# Patient Record
Sex: Female | Born: 1935
Health system: Southern US, Community
[De-identification: ages and names within clinical notes are randomized; demographics above are authoritative.]

## PROBLEM LIST (undated history)

## (undated) DIAGNOSIS — G629 Polyneuropathy, unspecified: Secondary | ICD-10-CM

## (undated) DIAGNOSIS — K219 Gastro-esophageal reflux disease without esophagitis: Secondary | ICD-10-CM

## (undated) DIAGNOSIS — I10 Essential (primary) hypertension: Secondary | ICD-10-CM

## (undated) DIAGNOSIS — Z9889 Other specified postprocedural states: Secondary | ICD-10-CM

## (undated) DIAGNOSIS — F419 Anxiety disorder, unspecified: Secondary | ICD-10-CM

## (undated) DIAGNOSIS — I739 Peripheral vascular disease, unspecified: Secondary | ICD-10-CM

## (undated) DIAGNOSIS — E119 Type 2 diabetes mellitus without complications: Secondary | ICD-10-CM

## (undated) DIAGNOSIS — I779 Disorder of arteries and arterioles, unspecified: Secondary | ICD-10-CM

## (undated) DIAGNOSIS — M1990 Unspecified osteoarthritis, unspecified site: Secondary | ICD-10-CM

## (undated) DIAGNOSIS — I480 Paroxysmal atrial fibrillation: Secondary | ICD-10-CM

## (undated) DIAGNOSIS — D649 Anemia, unspecified: Secondary | ICD-10-CM

## (undated) DIAGNOSIS — N183 Chronic kidney disease, stage 3 unspecified: Secondary | ICD-10-CM

## (undated) HISTORY — DX: Polyneuropathy, unspecified: G62.9

## (undated) HISTORY — DX: Anemia, unspecified: D64.9

## (undated) HISTORY — DX: Chronic kidney disease, stage 3 (moderate): N18.3

## (undated) HISTORY — DX: Anxiety disorder, unspecified: F41.9

## (undated) HISTORY — PX: SHOULDER ARTHROSCOPY W/ ROTATOR CUFF REPAIR: SHX2400

## (undated) HISTORY — DX: Other specified postprocedural states: Z98.890

## (undated) HISTORY — DX: Gastro-esophageal reflux disease without esophagitis: K21.9

## (undated) HISTORY — DX: Disorder of arteries and arterioles, unspecified: I77.9

## (undated) HISTORY — PX: ABDOMINAL HYSTERECTOMY: SHX81

## (undated) HISTORY — PX: CARPAL TUNNEL RELEASE: SHX101

## (undated) HISTORY — DX: Peripheral vascular disease, unspecified: I73.9

## (undated) HISTORY — DX: Chronic kidney disease, stage 3 unspecified: N18.30

## (undated) HISTORY — DX: Essential (primary) hypertension: I10

## (undated) HISTORY — PX: LUMBAR LAMINECTOMY: SHX95

## (undated) HISTORY — DX: Paroxysmal atrial fibrillation: I48.0

## (undated) HISTORY — PX: APPENDECTOMY: SHX54

---

## 2000-05-09 ENCOUNTER — Ambulatory Visit (HOSPITAL_COMMUNITY): Admission: RE | Admit: 2000-05-09 | Discharge: 2000-05-10 | Payer: Self-pay | Admitting: Ophthalmology

## 2000-05-09 ENCOUNTER — Encounter: Payer: Self-pay | Admitting: Ophthalmology

## 2001-03-18 ENCOUNTER — Encounter (INDEPENDENT_AMBULATORY_CARE_PROVIDER_SITE_OTHER): Payer: Self-pay | Admitting: Specialist

## 2001-03-18 ENCOUNTER — Ambulatory Visit (HOSPITAL_BASED_OUTPATIENT_CLINIC_OR_DEPARTMENT_OTHER): Admission: RE | Admit: 2001-03-18 | Discharge: 2001-03-19 | Payer: Self-pay | Admitting: Orthopedic Surgery

## 2003-06-16 ENCOUNTER — Inpatient Hospital Stay (HOSPITAL_COMMUNITY): Admission: EM | Admit: 2003-06-16 | Discharge: 2003-06-18 | Payer: Self-pay | Admitting: Cardiology

## 2003-12-17 ENCOUNTER — Encounter: Admission: RE | Admit: 2003-12-17 | Discharge: 2004-01-18 | Payer: Self-pay | Admitting: Family Medicine

## 2005-03-29 ENCOUNTER — Ambulatory Visit: Payer: Self-pay | Admitting: Cardiology

## 2005-05-02 ENCOUNTER — Ambulatory Visit: Payer: Self-pay | Admitting: Cardiology

## 2005-05-15 ENCOUNTER — Ambulatory Visit: Payer: Self-pay | Admitting: Cardiology

## 2005-06-18 ENCOUNTER — Ambulatory Visit: Payer: Self-pay | Admitting: Cardiology

## 2007-04-14 ENCOUNTER — Ambulatory Visit: Payer: Self-pay | Admitting: Cardiology

## 2007-05-04 ENCOUNTER — Encounter: Payer: Self-pay | Admitting: Cardiology

## 2008-10-20 ENCOUNTER — Encounter: Payer: Self-pay | Admitting: Cardiology

## 2009-02-15 ENCOUNTER — Encounter: Payer: Self-pay | Admitting: Cardiology

## 2009-03-28 ENCOUNTER — Encounter: Payer: Self-pay | Admitting: Cardiology

## 2009-04-21 ENCOUNTER — Encounter: Payer: Self-pay | Admitting: Cardiology

## 2009-04-21 ENCOUNTER — Ambulatory Visit: Payer: Self-pay | Admitting: Cardiology

## 2009-05-24 ENCOUNTER — Ambulatory Visit: Payer: Self-pay | Admitting: Cardiology

## 2009-06-06 ENCOUNTER — Ambulatory Visit: Payer: Self-pay | Admitting: Cardiology

## 2009-06-06 ENCOUNTER — Encounter: Payer: Self-pay | Admitting: Cardiology

## 2009-06-21 ENCOUNTER — Encounter: Payer: Self-pay | Admitting: Cardiology

## 2009-07-01 ENCOUNTER — Ambulatory Visit: Payer: Self-pay | Admitting: Cardiology

## 2009-07-19 ENCOUNTER — Encounter: Payer: Self-pay | Admitting: Cardiology

## 2009-07-19 ENCOUNTER — Ambulatory Visit: Payer: Self-pay | Admitting: Vascular Surgery

## 2009-10-17 ENCOUNTER — Ambulatory Visit: Payer: Self-pay | Admitting: Cardiology

## 2010-01-05 ENCOUNTER — Encounter: Admission: RE | Admit: 2010-01-05 | Discharge: 2010-01-05 | Payer: Self-pay | Admitting: Orthopedic Surgery

## 2010-01-19 ENCOUNTER — Inpatient Hospital Stay (HOSPITAL_COMMUNITY): Admission: RE | Admit: 2010-01-19 | Discharge: 2010-01-22 | Payer: Self-pay | Admitting: Orthopedic Surgery

## 2010-08-22 ENCOUNTER — Ambulatory Visit: Payer: Self-pay | Admitting: Vascular Surgery

## 2010-09-20 ENCOUNTER — Encounter: Payer: Self-pay | Admitting: Cardiology

## 2010-10-05 ENCOUNTER — Encounter: Payer: Self-pay | Admitting: Cardiology

## 2010-10-18 ENCOUNTER — Ambulatory Visit: Payer: Self-pay | Admitting: Cardiology

## 2010-12-10 HISTORY — PX: TONSILLECTOMY: SUR1361

## 2011-01-09 NOTE — Assessment & Plan Note (Signed)
Summary: 1 YR FUL   Visit Type:  Follow-up Primary Provider:  Donzetta Sprung, MD  CC:  CAD.  History of Present Illness: Patient is seen for followup of her cardiac status.  I saw her last in November, 2010.  At that time her carotid artery disease is being followed by the vascular surgeons.  She has mild mitral regurgitation.  She watches her blood pressure and it is normal at home.  We felt that her chest pain workup had shown no major abnormalities.  She's not had any pain returned since then.  Preventive Screening-Counseling & Management  Alcohol-Tobacco     Smoking Status: never  Current Medications (verified): 1)  Hydrochlorothiazide 25 Mg Tabs (Hydrochlorothiazide) .... Take 1/2  Tablet By Mouth Daily. 2)  Furosemide 40 Mg Tabs (Furosemide) .... Take 1/2-1 Tablet By Mouth Daily As Needed 3)  Diltiazem Hcl Er Beads 360 Mg Xr24h-Cap (Diltiazem Hcl Er Beads) .... Take One Capsule By Mouth Daily 4)  Omeprazole 20 Mg Cpdr (Omeprazole) .... Take 2 Tablet By Mouth Two Times A Day 5)  Benazepril Hcl 40 Mg Tabs (Benazepril Hcl) .... Take 1 Tablet By Mouth Once A Day 6)  Aspirin 81 Mg Tbec (Aspirin) .... Take One Tablet By Mouth Daily 7)  Alendronate Sodium 70 Mg Tabs (Alendronate Sodium) .... Take 1 Tablet By Mouth Once Per Week. 8)  Simvastatin 20 Mg Tabs (Simvastatin) .... Take One Tablet By Mouth Daily At Bedtime 9)  Lorazepam 0.5 Mg Tabs (Lorazepam) .... Take 1 Tablet By Mouth Three Times A Day As Needed 10)  Rena-Vite  Tabs (B Complex-C-Folic Acid) .... Take 1 Tablet By Mouth Once A Day 11)  Azelastine Hcl 0.05 % Soln (Azelastine Hcl) .... Use 1 Drop in Each Eye Twice Daily. 12)  Fish Oil Double Strength 1200 Mg Caps (Omega-3 Fatty Acids) .... Take 1 Capsule By Mouth Once A Day 13)  Multivitamins  Tabs (Multiple Vitamin) .... Take 1 Tablet By Mouth Once A Day 14)  Lantus Solostar 100 Unit/ml Soln (Insulin Glargine) .... As Needed 15)  Ferrous Sulfate 325 (65 Fe) Mg Tabs (Ferrous  Sulfate) .... Take 1 Tablet By Mouth Once A Day 16)  Caltrate 600+d Plus 600-400 Mg-Unit Tabs (Calcium Carbonate-Vit D-Min) .... Take 1 Tablet By Mouth Once A Day  Allergies (verified): 1)  ! Iodine 2)  ! * Latex 3)  ! * Shellfish 4)  * Ivp Dye 5)  Pcn 6)  Erythromycin  Comments:  Nurse/Medical Assistant: The patient's medication bottles and allergies were reviewed with the patient and were updated in the Medication and Allergy Lists.  Past History:  Past Medical History: HYPERTENSION, UNSPECIFIED (ICD-401.9) HYPERLIPIDEMIA-MIXED (ICD-272.4) CHEST PAIN-UNSPECIFIED (ICD-786.50)...catheterization... 2004... normal coronaries  /   thallium scan.. May, 2010.. no ischemia Mild chronic renal insufficiency diabetes... type II Peripheral neuropathy Atrial fibrillation  ???  question in the past.. not a recurring problem carotid artery disease  50-70% LICA.Marland Kitchen less than 50% RICA.Marland Kitchen Doppler.. June, 2010  /  Dr. Hart Rochester consultation.. August, 2010.. his protocol to follow patient LVH mild.... echo.  June, 2010 EF 60-65%... echo... June, 2010 Mitral regurgitation... mild... echo, 2010 Atrial septal aneurysm... echo... 2010.Marland Kitchen GERD Renal insufficiency.... mild Edema... mild... chronic Anxiety disorder  Review of Systems       Patient denies fever, chills, headache, sweats, rash, change in vision, change in hearing, chest pain, nausea vomiting, urinary symptoms.  All other systems are reviewed and are negative.  Vital Signs:  Patient profile:   77 year  old female Height:      70 inches Weight:      166 pounds BMI:     23.90 Pulse rate:   81 / minute BP sitting:   163 / 95  (left arm) Cuff size:   regular  Vitals Entered By: Carlye Grippe (October 18, 2010 1:11 PM)  Physical Exam  General:  patient looks good. Eyes:  no xanthelasma. Neck:  no jugular venous distention. Lungs:  lungs are clear.  Respiratory effort is nonlabored. Heart:  cardiac exam reveals S1-S2.  There is a  soft systolic murmur. Abdomen:  abdomen is soft. Extremities:  no peripheral edema. Psych:  patient is oriented to person time and place.  Affect is normal.   Impression & Recommendations:  Problem # 1:  CAROTID ARTERY DISEASE (ICD-433.10) Carotid disease is followed by Dr. Hart Rochester.  She is doing well.  Problem # 2:  MITRAL REGURGITATION (ICD-396.3) Mitral regurgitation is mild.  She does not need an echo.  Problem # 3:  EDEMA (ICD-782.3) She has trace edema in her left lower extremity.  This is not a significant issue.  I suspect it is mild venous insufficiency  Problem # 4:  HYPERTENSION, UNSPECIFIED (ICD-401.9)  Her updated medication list for this problem includes:    Hydrochlorothiazide 25 Mg Tabs (Hydrochlorothiazide) .Marland Kitchen... Take 1/2  tablet by mouth daily.    Furosemide 40 Mg Tabs (Furosemide) .Marland Kitchen... Take 1/2-1 tablet by mouth daily as needed    Diltiazem Hcl Er Beads 360 Mg Xr24h-cap (Diltiazem hcl er beads) .Marland Kitchen... Take one capsule by mouth daily    Benazepril Hcl 40 Mg Tabs (Benazepril hcl) .Marland Kitchen... Take 1 tablet by mouth once a day    Aspirin 81 Mg Tbec (Aspirin) .Marland Kitchen... Take one tablet by mouth daily Blood pressure is elevated today in the office.  She insists that remains normal at home and it is normal at Dr. Rosann Auerbach office.  I will not change her medicines today.  Problem # 5:  CHEST PAIN-UNSPECIFIED (ICD-786.50)  Her updated medication list for this problem includes:    Diltiazem Hcl Er Beads 360 Mg Xr24h-cap (Diltiazem hcl er beads) .Marland Kitchen... Take one capsule by mouth daily    Benazepril Hcl 40 Mg Tabs (Benazepril hcl) .Marland Kitchen... Take 1 tablet by mouth once a day    Aspirin 81 Mg Tbec (Aspirin) .Marland Kitchen... Take one tablet by mouth daily She is not having any recurrent chest pain.  No further workup is needed.  We'll see her back in one year.  Other Orders: EKG w/ Interpretation (93000)  Patient Instructions: 1)  Your physician wants you to follow-up in: 1 year. You will receive a  reminder letter in the mail one-two months in advance. If you don't receive a letter, please call our office to schedule the follow-up appointment. 2)  Your physician recommends that you continue on your current medications as directed. Please refer to the Current Medication list given to you today.

## 2011-02-14 ENCOUNTER — Ambulatory Visit: Payer: Medicare Other | Attending: Orthopedic Surgery | Admitting: Physical Therapy

## 2011-02-14 DIAGNOSIS — M542 Cervicalgia: Secondary | ICD-10-CM | POA: Insufficient documentation

## 2011-02-14 DIAGNOSIS — IMO0001 Reserved for inherently not codable concepts without codable children: Secondary | ICD-10-CM | POA: Insufficient documentation

## 2011-02-14 DIAGNOSIS — M25519 Pain in unspecified shoulder: Secondary | ICD-10-CM | POA: Insufficient documentation

## 2011-02-21 ENCOUNTER — Ambulatory Visit: Payer: Medicare Other | Admitting: Physical Therapy

## 2011-02-23 ENCOUNTER — Ambulatory Visit: Payer: Medicare Other | Admitting: Physical Therapy

## 2011-02-28 ENCOUNTER — Ambulatory Visit: Payer: Medicare Other | Admitting: Physical Therapy

## 2011-03-01 LAB — CBC
HCT: 32.5 % — ABNORMAL LOW (ref 36.0–46.0)
Hemoglobin: 11.6 g/dL — ABNORMAL LOW (ref 12.0–15.0)
Hemoglobin: 7.8 g/dL — ABNORMAL LOW (ref 12.0–15.0)
MCHC: 35.1 g/dL (ref 30.0–36.0)
MCHC: 35.5 g/dL (ref 30.0–36.0)
MCV: 94.2 fL (ref 78.0–100.0)
Platelets: 164 10*3/uL (ref 150–400)
Platelets: 169 10*3/uL (ref 150–400)
Platelets: 218 10*3/uL (ref 150–400)
RBC: 2.28 MIL/uL — ABNORMAL LOW (ref 3.87–5.11)
RDW: 13.1 % (ref 11.5–15.5)
RDW: 13.5 % (ref 11.5–15.5)
WBC: 6.6 10*3/uL (ref 4.0–10.5)
WBC: 7.5 10*3/uL (ref 4.0–10.5)

## 2011-03-01 LAB — COMPREHENSIVE METABOLIC PANEL
CO2: 28 mEq/L (ref 19–32)
Creatinine, Ser: 1.51 mg/dL — ABNORMAL HIGH (ref 0.4–1.2)
GFR calc Af Amer: 40 mL/min — ABNORMAL LOW (ref >60)
Glucose, Bld: 161 mg/dL — ABNORMAL HIGH (ref 70–99)
Total Protein: 7.5 g/dL (ref 6.0–8.3)

## 2011-03-01 LAB — PROTIME-INR: Prothrombin Time: 12.8 seconds (ref 11.6–15.2)

## 2011-03-01 LAB — GLUCOSE, CAPILLARY
Glucose-Capillary: 132 mg/dL — ABNORMAL HIGH (ref 70–99)
Glucose-Capillary: 137 mg/dL — ABNORMAL HIGH (ref 70–99)
Glucose-Capillary: 139 mg/dL — ABNORMAL HIGH (ref 70–99)
Glucose-Capillary: 145 mg/dL — ABNORMAL HIGH (ref 70–99)
Glucose-Capillary: 151 mg/dL — ABNORMAL HIGH (ref 70–99)
Glucose-Capillary: 152 mg/dL — ABNORMAL HIGH (ref 70–99)
Glucose-Capillary: 167 mg/dL — ABNORMAL HIGH (ref 70–99)
Glucose-Capillary: 81 mg/dL (ref 70–99)

## 2011-03-01 LAB — HEMOGLOBIN AND HEMATOCRIT, BLOOD: HCT: 21.4 % — ABNORMAL LOW (ref 36.0–46.0)

## 2011-03-01 LAB — BASIC METABOLIC PANEL
BUN: 18 mg/dL (ref 6–23)
CO2: 27 mEq/L (ref 19–32)
Calcium: 8.2 mg/dL — ABNORMAL LOW (ref 8.4–10.5)
Creatinine, Ser: 1.23 mg/dL — ABNORMAL HIGH (ref 0.4–1.2)
Glucose, Bld: 156 mg/dL — ABNORMAL HIGH (ref 70–99)

## 2011-03-01 LAB — APTT: aPTT: 37 seconds (ref 24–37)

## 2011-03-02 ENCOUNTER — Ambulatory Visit: Payer: Medicare Other | Admitting: Physical Therapy

## 2011-03-07 ENCOUNTER — Ambulatory Visit: Payer: Medicare Other | Admitting: Physical Therapy

## 2011-03-09 ENCOUNTER — Encounter: Payer: Medicare Other | Admitting: Physical Therapy

## 2011-04-24 NOTE — Consult Note (Signed)
NEW PATIENT CONSULTATION   Shari Golden, Shari Golden  DOB:  [birth year]                                       07/19/2009  ZOXWR#:[id number]   The patient is a 77 year old female referred for carotid evaluation by  Dr. Jerral Bonito.  The patient denies any history of stroke, amaurosis  fugax, hemiparesis, aphasia, dizziness, blurred vision or syncope.  She  has been evaluated for potential cardiac symptoms on a few occasions in  the past, including cardiac cath, which was normal several years ago.  Recent thallium stress test was unremarkable.  She was found to have a  carotid disease in the 50% to 60% range bilaterally and was referred for  evaluation.   PAST MEDICAL HISTORY:  1. Type 1 diabetes mellitus.  2. Hypertension.  3. GERD.  4. Peripheral neuropathy.  5. Mild renal insufficiency.  6. Anxiety disorder.  7. Negative for coronary artery disease, hyperlipidemia, COPD or      stroke.   PAST SURGICAL HISTORY:  1. Hysterectomy.  2. Appendectomy.  3. Bilateral carpal tunnel surgery.  4. Left rotator cuff shoulder surgery.  5. Bilateral elbow surgeries.  6. Removal of benign breast cysts.   FAMILY HISTORY:  Positive for diabetes in sister and negative for  coronary artery disease and stroke.   SOCIAL HISTORY:  She is widowed, has one child and is retired.  Does not  use tobacco or alcohol.   REVIEW OF SYSTEMS:  Positive for occasional melenic stools,  constipation, joint pain and anemia.   ALLERGIES:  To latex, iodine, penicillin and erythromycin.   MEDICATIONS:  Please see health history form.   PHYSICAL EXAMINATION:  Blood pressure 139/71, heart rate 65,  respirations 14.  General:  She is a healthy-appearing female in no  apparent distress, alert and oriented x3.  Neck:  Supple, 3+ carotid  pulses palpable.  There are soft bruits bilaterally.  Neurologic: Exam  is normal.  No palpable adenopathy in neck.  Chest:  Clear to  auscultation.  Cardiovascular:   Regular rhythm.  No murmurs.  Abdomen:  Soft, nontender with no masses.  She has 3+ femoral, popliteal and 2+  dorsalis pedis pulses bilaterally.   I reviewed the carotid duplex exam done at [facility] and this  reveals bilateral carotid disease in the 50% range on both sides.   I do not think that her disease is severe enough to warrant  endarterectomy on an asymptomatic basis and she had no symptoms that I  can determine.  I think she should have annual follow-up to make sure  these are not progressing and if she develops any symptoms, she will be  in touch with me.  Will arrange for follow-up on the carotid protocol on  an annual basis so these will not get lost to follow up.   Quita Skye Hart Rochester, M.D.  Electronically Signed   JDL/MEDQ  D:  07/19/2009  T:  07/20/2009  Job:  [id number]   cc:   Orlean Bradford, MD, [facility]

## 2011-04-24 NOTE — Procedures (Signed)
CAROTID DUPLEX EXAM   INDICATION:  Carotid stenosis   HISTORY:  Diabetes:  yes  Cardiac:  no  Hypertension:  yes  Smoking:  previous  Previous Surgery:  none  CV History:  Currently asymptomatic.  Amaurosis Fugax No, Paresthesias No, Hemiparesis No                                       RIGHT             LEFT  Brachial systolic pressure:         142               144  Brachial Doppler waveforms:         normal            normal  Vertebral direction of flow:        antegrade         antegrade  DUPLEX VELOCITIES (cm/sec)  CCA peak systolic                   102               105  ECA peak systolic                   90                76  ICA peak systolic                   100               70  ICA end diastolic                   26                20  PLAQUE MORPHOLOGY:                  Heterogeneous     Heterogeneous  PLAQUE AMOUNT:                      minimal           minimal  PLAQUE LOCATION:                    ICA/ECA           ICA/CCA   IMPRESSION:  No evidence of hemodynamically significant stenosis of the  bilateral internal carotid arteries noted with minimal plaque formations  as described above.   ___________________________________________  Quita Skye. Hart Rochester, M.D.   CH/MEDQ  D:  08/23/2010  T:  08/23/2010  Job:  [id number]

## 2011-04-24 NOTE — Assessment & Plan Note (Signed)
[facility]                          [facility] OFFICE NOTE   Shari, Golden                      MRN:          [id number]  DATE:05/24/2009                            DOB:          08/12/1930    Shari Golden is referred for the evaluation of her chest heaviness.  I  have seen her in the past, but not since July 2006.  We know that she  has a history of hypertension.  Because of chest discomfort in the past,  she has actually undergone coronary angiography in July 2004 with no  significant coronary disease.  We know she has normal LV function.  She  has had some mild peripheral edema over time.  She does have peripheral  neuropathy and diabetes.   Recently, she describes chest heaviness and squeezing when she is  cooking heavy meals.  However, she does not have this symptom when she  walks.  There is no radiation of this symptom.  There is no nausea,  vomiting, or diaphoresis.  It does not limit her overall activities.  She underwent a stress thallium study on Apr 21, 2009.  She walked for 4  minutes and 30 seconds.  She did have a hypertensive response.  She had  some mild shortness of breath.  There were no diagnostic EKG changes.  The images revealed some mild breast attenuation, but no obvious  significant ischemia.  The ejection fraction was 61%.   Today in the office, she is stable.   PAST MEDICAL HISTORY:   ALLERGIES:  LATEX, IODINE, PENICILLIN, ERYTHROMYCIN.   MEDICATIONS:  Lorazepam, diltiazem, hydrochlorothiazide, multivitamin,  fish oil, aspirin, benazepril, Lasix, simvastatin, Fosamax, eye drops.   OTHER MEDICAL PROBLEMS:  See the list below.   REVIEW OF SYSTEMS:  Today, she has no fevers or chills or skin rashes.  There is no headache.  There is no change in her vision or her hearing.  There is no cough or shortness of breath.  There is no chest pain.  She  is not having any GI or GU symptoms.  She does have mild  peripheral  edema that is old.  All other systems are reviewed and are negative.   PHYSICAL EXAMINATION:  VITAL SIGNS:  Blood pressure is 130/60 with a  pulse of 51.  Weight is 174 pounds.  GENERAL:  The patient is oriented to person, time, and place.  Affect is  normal.  HEENT:  No xanthelasma.  She has normal extraocular motion.  NECK:  There is no jugular venous distention.  She does have a left  carotid bruit.  CARDIAC:  S1 with an S2.  There are no clicks.  There is a 3/6 systolic  murmur.  ABDOMEN:  Soft.  EXTREMITIES:  She has 1+ peripheral edema.  MUSCULOSKELETAL:  There are no musculoskeletal deformities.   EKG reveals sinus bradycardia with nonspecific ST-T wave changes.  I  have personally reviewed the EKG.   PROBLEMS INCLUDE:  1. Diabetes.  2. Peripheral neuropathy.  3. History of hypertension.  She had normal renal arteries by cath in  2004.  Her blood pressure is controlled at this time.  4. Question of atrial fibrillation in the past, but this is not a      recurring problem.  5. Gastroesophageal reflux disease.  6. Lipid abnormality.  7. History of mild renal insufficiency.  8. History of normal left ventricular function.  9. History of normal coronary arteries in July 2004.  10.Mild ongoing peripheral edema that is unchanged.  11.History of an anxiety disorder by history.  12.Recent symptom of feeling a heavy sensation in her chest while      doing cooking and cutting of vegetables, but not while walking.  At      this point, there is no proof of significant ischemia.  Her      thallium scan did not reveal significant ischemia.  13.Left carotid bruit.  She needs a carotid Doppler.  14.Systolic murmur.  It is time for follow up 2-D echo.    The patient will have carotid Dopplers and a 2-D echo, and I will see  her back for followup.     Luis Abed, MD, [facility]  Electronically Signed    JDK/MedQ  DD: 05/24/2009  DT: 05/25/2009  Job #: [id number]    cc:   Donzetta Sprung

## 2011-04-24 NOTE — Assessment & Plan Note (Signed)
[facility]                          [facility] OFFICE NOTE   Shari, Golden                      MRN:          [id number]  DATE:07/01/2009                            DOB:          04-12-1930    PRIMARY CARDIOLOGIST:  Shari Abed, MD, [facility]   REASON FOR VISIT:  Scheduled followup.   Please refer to Dr. Henrietta Golden recent office note of May 24, 2009, for  complete details.   At that time, Shari Golden was referred for further evaluation of  exertional upper chest discomfort, but associated only with preparing  meals.  She had had an exercise stress thallium study on Apr 21, 2009,  which was negative for any definite ischemia.   Most recent workup consisted of both a 2-D echocardiogram, as well as  carotid Dopplers.  Echocardiography indicated normal LVF (60-65%), with  no focal wall motion abnormalities.  Mild mitral regurgitation was  noted.   Carotid Dopplers did indicate 50-70% left ICA stenosis, with less than  50% on the right.  Given this finding, she was referred to Dr. Josephina Golden, with whom she is scheduled to follow early next month.   Clinically, Shari Golden denies any exacerbation of her baseline  symptoms.  In fact, she states that these have occurred less often,  given that she has curtailed the amount of heavy preparation that she  does for some of her meals.  Once again, she denies any chest discomfort  or throat discomfort associated with any other type of activity,  including walking up a flight of stairs or working in the yard.   CURRENT MEDICATIONS:  Unchanged from previous visit.   PHYSICAL EXAMINATION:  VITAL SIGNS:  Blood pressure is 125/59, pulse is  61 and regular, and weight is 172.  GENERAL:  A 77 year old female sitting upright in no distress.  HEENT:  Normocephalic, atraumatic.  NECK:  Palpable carotid pulses with soft, bilateral bruits (left greater  than right).  LUNGS:  Clear to auscultation in all  fields.  HEART:  Regular rate and rhythm.  A soft grade 2-3/6 holosystolic murmur  at the base.  ABDOMEN:  Soft, nontender.  EXTREMITIES:  Trace edema.  NEUROLOGIC:  No focal deficit.   IMPRESSION:  1. Atypical chest discomfort.      a.     Recent negative adequate exercise stress thallium.      b.     Normal LVF with no focal wall motion abnormalities.      c.     Normal cardiac catheterization, July 2004.  2. Cerebrovascular disease.      a.     A 50-70% left internal carotid artery stenosis, by duplex       imaging.  3. Type 2 diabetes mellitus.  4. Hypertension.  5. Mild chronic renal insufficiency.  6. Dyslipidemia.      a.     Previously documented low LDL of 40.   PLAN:  1. No further workup indicated at this point in time.  2. Continue current medication regimen, including low-dose aspirin      indefinitely.  3. The patient is to follow up with Dr. Josephina Golden in [place] on      July 19, 2009, as scheduled, in follow up of her recent abnormal      carotid Dopplers.  4. Schedule return clinic followup with myself and Dr. Myrtis Golden in      approximately 3 months.  Of note, pending Dr. Candie Golden findings and      recommendations, we may need to consider a diagnostic cardiac      catheterization, in the event that carotid endarterectomy is      recommended, in order to clear her for surgery.      Shari Searing, PA-C  Electronically Signed      Shari Abed, MD, [facility]  Electronically Signed   GS/MedQ  DD: 07/01/2009  DT: 07/02/2009  Job #: [id number]   cc:   Shari Golden

## 2011-04-27 NOTE — Discharge Summary (Signed)
   NAME:  Shari Golden, Shari Golden NO.:  [id number]   MEDICAL RECORD NO.:  [id number]                   PATIENT TYPE:  INP   LOCATION:  3729                                 FACILITY:  [facility]   PHYSICIAN:  Jonelle Sidle, M.D. [facility]        DATE OF BIRTH:  16-Dec-1929   DATE OF ADMISSION:  06/16/2003  DATE OF DISCHARGE:  06/18/2003                                 DISCHARGE SUMMARY   ADDENDUM   It should be noted that the patient received prophylaxis with prednisone,  Benadryl, and Pepcid prior to catheterization secondary to her history of  shrimp allergy.  She is also allergic to LATEX, PENICILLIN, IODINE,  LIDOCAINE, ERYTHROMYCIN, MEPIVICAINE.     Delton See, P.A. [facility]                  Jonelle Sidle, M.D. [facility]    DR/MEDQ  D:  06/18/2003  T:  06/18/2003  Job:  [id number]

## 2011-04-27 NOTE — Op Note (Signed)
[facility]  Patient:    Shari Golden, Shari Golden                      MRN: [id number] Proc. Date: 05/09/00 Adm. Date:  [id number] Disc. Date: [id number] Attending:  Bertrum Sol                           Operative Report  DATE OF BIRTH:  11-Jun-1930  ADMITTING DIAGNOSES:  Vitreous hemorrhage in the left eye, proliferative diabetic retinopathy, left eye.  PROCEDURES PERFORMED:  Pars plana vitrectomy, left eye, and retinal photocoagulation, left eye.  SURGEON:  Beulah Gandy. Ashley Royalty, M.D.  ASSISTANT:  Lu Duffel and Marion Downer, R.N.  ANESTHESIA:  General.  DESCRIPTION OF PROCEDURE:  Usual prep and drape.  Peritomies at 10, 2, and 4 oclock.  A 4 mm ______  anchored into place at 4 oclock.  Lighted pick and the cutter were placed at 10 and 2 oclock respectively.  Contact lens ring anchored into place at 6 and 12 oclock.  Methylcellulose placed on the cornea and the flat contact lens was placed.  The pars plana vitrectomy was begun just behind the crystalline lens.  Blood and vitreous were encountered. These were carefully removed under low suction and rapid cutting.  The vitrectomy was carried down to the macular surface where blood was encountered and vacuumed from the surface.  The vitrectomy was carried into the far periphery with a 30-degree prismatic viewing lens.  The vitreous and blood were trimmed down to the vitreous base for 360 degrees.  Scleral depression was used.  Once all the blood and vitreous was removed from the eye, the endolaser was placed in the eye.  There were 561 burns placed around the retinal periphery with a power of 400 milliwatts, 1000 microns each and 0.1 seconds each.  A washout procedure was performed and the fluid was totally clear.  The instruments were removed from the eye and 9-0 nylon was used to close the sclerotomy sites.  The conjunctiva was reposited with 7-0 chromic suture.  Polymyxin and gentamicin  were irrigated into tenon space.  Atropine solution was applied.  Decadron 10 mg was injected into the lower subconjunctival space.  The closing tension was 10 with a Barraquer tonometer. Complications - none.  Duration - one hour.  Polysporin, patch, and shied were placed.  The patient was awakened and taken to recovery in satisfactory condition. DD:  05/09/00 TD:  05/13/00 Job: [id number]

## 2011-04-27 NOTE — Cardiovascular Report (Signed)
NAME:  Shari Golden, Shari Golden NO.:  [id number]   MEDICAL RECORD NO.:  [id number]                   PATIENT TYPE:  INP   LOCATION:  3729                                 FACILITY:  [facility]   PHYSICIAN:  Salvadore Farber, M.D.             DATE OF BIRTH:  1930/07/06   DATE OF PROCEDURE:  06/17/2003  DATE OF DISCHARGE:                              CARDIAC CATHETERIZATION   PROCEDURE:  Left heart catheterization, left ventriculography, abdominal  aortography, coronary angiography.   INDICATIONS:  The patient is a 77 year old lady with normal cardiac  catheterization 10 years ago who presents with profound fatigue associated  with fairly vague symptoms of shortness of breath and chest pressure.  This  has been progressive over the past month.  She was hospitalized and ruled  out for myocardial infarction.  She was referred for diagnostic angiography.   PROCEDURAL TECHNIQUE:  The patient has allergies to contrast dye, lidocaine,  and latex.  Latex free equipment was utilized.  The patient was pre  medicated with prednisone, Benadryl, and Zantac.  Under 1% novocaine  anesthesia a 6-French sheath was placed in the right femoral artery using  the modified Seldinger technique.  Diagnostic angiography and  ventriculography were performed using JL4, JR4, and pigtail catheters.  The  pigtail catheter was then pulled back to the abdominal aorta.  Abdominal  aortography was performed by power injection.   COMPLICATIONS:  None.   FINDINGS:  1. LV 171/3/16.  EF 70% without regional wall motion abnormality.  2. No aortic stenosis or mitral regurgitation.  3. Left main:  Angiographically normal.  4. LAD:  Moderate sized vessel giving rise to a single diagonal branch.  It     is angiographically normal.  5. Ramus intermedius:  Moderate sized vessel which is angiographically     normal.  6. Circumflex:  Moderate sized vessel giving rise to a single obtuse     marginal.  It  is angiographically normal.  7. RCA:  Large, dominant vessel.  It is angiographically normal.   ABDOMINAL AORTOGRAPHY:  Normal abdominal aorta.  Normal single renal  arteries bilaterally.   IMPRESSION/RECOMMENDATIONS:  1. Angiographically normal coronary arteries.  2. Normal left ventricular size and systolic function.  3. Normal single renal arteries bilaterally.   There is no clear cardiovascular etiology to her shortness of breath and  fatigue.  Further work-up per both Jonelle Sidle, M.D. [facility] and Donzetta Sprung, M.D.                                               Salvadore Farber, M.D.    WED/MEDQ  D:  06/17/2003  T:  06/18/2003  Job:  [id number]  Donzetta Sprung  [address], [address]  [place]  [place] [address]  Fax: [phone]   Jonelle Sidle, M.D. [facility]   cc:   Donzetta Sprung  [address], [address]  [place]  [place] [address]  Fax: [phone]   Jonelle Sidle, M.D. [facility]

## 2011-04-27 NOTE — Discharge Summary (Signed)
NAME:  Shari Golden, Shari Golden NO.:  [id number]   MEDICAL RECORD NO.:  [id number]                   PATIENT TYPE:  INP   LOCATION:  3729                                 FACILITY:  [facility]   PHYSICIAN:  Jonelle Sidle, M.D. 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  The patient presented  to [facility] with a two-week history of nonspecific complaints  including fatigue and dyspnea.  She was admitted for further evaluation.  She was seen in consultation by Dr. Diona Browner.  She reportedly had a brief  episode of atrial fibrillation with spontaneous conversion to normal sinus  rhythm.  Decision was eventually made to transfer the patient to [facility] for cardiac catheterization.   The patient previously had a heart catheterization approximately 10 years  ago performed by Dr. Daisy Floro.  It apparently was negative.  She had a  nuclear study performed April 07, 2002, with adenosine which did not show  any ischemia.  EF was 64%.  There was a small apical defect.  There was a 2-  D echo done at that time which showed an EF of 65-70% with mild MR, aortic  valve sclerosis, but no stenosis, with possible diastolic dysfunction.   PAST MEDICAL HISTORY:  Please see history as noted above.   ALLERGIES:  Multiple allergies including ERYTHROMYCIN, LIDOCAINE,  PENICILLIN, IODINE, LATEX, SHRIMP, and MEPIVACAINE.   HOSPITAL COURSE:  The patient was transferred to [facility] on June 16, 2003,  to further evaluate her dyspnea and fatigue as well as apparently  new onset of paroxysmal atrial fibrillation.  She underwent cardiac  catheterization on June 17, 2003, performed by Dr. Samule Ohm.  She had no  significant coronary artery disease.  Ejection fraction was estimated to be  70%.   The patient was seen the following day by Dr. Diona Browner, and arrangements  were made to discharge her.   The patient had been on Lotensin HCT prior to admission.  Dr. Diona Browner  thought that the patient was dry and that the HCT portion should be  discontinued.  As noted, the patient has a possible history of paroxysmal  atrial fibrillation, but Dr. Diona Browner noted that there were no objective  documentations of the arrhythmia.  He did not feel Coumadin was indicated at  this time.  He felt if the patient continued to have palpitations she could  have an event monitor on an outpatient basis.   LABORATORY DATA:  A CBC on the day of  discharge revealed hemoglobin 11.1,  hematocrit 31.7, WBC 4.7 thousand, platelets 183,000.  Chemistry profile on  the day of discharge revealed BUN 21, creatinine 1.3, potassium 4.4, glucose  284.  A lipid profile revealed cholesterol 111, triglycerides 49, HDL was  55, LDL was 46.   DISCHARGE MEDICATIONS:  The patient was discharged on:  1. Aspirin 81 mg daily.  2. Diltiazem 300 mg daily.  3. Clonidine 0.2 mg daily.  4. Zocor 20 mg at bedtime.  5. Nexium as previously taken.  6. Tenex 2 mg at bedtime.  7. Lotensin 20 mg daily in place of her Lotensin HEMATOCRIT.  8. Humulin NPH Insulin 20 units each morning.  9. Celebrex as needed.  10.      She was told to take Tylenol as needed for pain.   The patient was told to avoid any strenuous activity or driving for two  days.  She was not to lift more than 10 pounds for one week.  She was to be  on a low-salt, low-fat diet.  She was told to call the [place] office if she  had any increased pain, swelling, or bleeding from her  groin.  She was to  follow up with Dr. Diona Browner July 01, 2003, Thursday, at 1 p.m., Dr. Reuel Boom  within the next several weeks.   PROBLEM LIST:  At the time of discharge:  1. Dyspnea and fatigue of uncertain etiology.  2. Cardiac catheterization performed June 17, 2003, revealing no significant     coronary artery disease, with ejection fraction 70%.  3. History of diabetes.  4. History of hypertension.  5. Questionable paroxysmal atrial fibrillation with no objective     documentation.  6. Chronic anemia.  7. Chronic renal insufficiency.  8. Gastroesophageal reflux disease.  9. History of hyperlipidemia.   PLAN:  The patient will follow up with the [place] office in approximately  two weeks for a groin check following her catheterization.  If she continues  to have palpitations, we will consider an event monitor to try to document  atrial fibrillation.  No Coumadin was felt to be indicated at this time.     Delton See, P.A. [facility]                  Jonelle Sidle, M.D. [facility]    DR/MEDQ  D:  06/18/2003  T:  06/18/2003  Job:  [id number]   cc:   Donzetta Sprung  [address]  [place]  [place] [address]  Fax: [phone]   [facility]  [address]  [place], [place] [address]    cc:   Donzetta Sprung  [address]  [place]  [place] [address]  Fax: [phone]   [facility]  [address]  [place], [place] [address]

## 2011-04-27 NOTE — Op Note (Signed)
[place]  Patient:    Shari Golden, Shari Golden                      MRN: [id number] Proc. Date: 03/18/01 Adm. Date:  [id number] Attending:  Susa Day                           Operative Report  PREOPERATIVE DIAGNOSIS:  Chronic stage 2 or 3 impingement, left shoulder, with acromioclavicular degenerative arthritis and probable full thickness rotator cuff tear with MRI evidence of severe tendinopathy and/or full thickness rotator cuff tear of supraspinatus and infraspinatus.  POSTOPERATIVE DIAGNOSIS:  Chronic stage 2 or 3 impingement, left shoulder, with acromioclavicular degenerative arthritis and probable full thickness rotator cuff tear with MRI evidence of severe tendinopathy and/or full thickness rotator cuff tear of supraspinatus and infraspinatus.  OPERATION PERFORMED: 1. Diagnostic arthroscopy, left glenohumeral joint. 2. Arthroscopic subacromial bursectomy and anterior acromioplasty. 3. Open repair of rotator with resection of necrotic supraspinatus,    infraspinatus and repair in the manner of McLaughlin to greater tuberosity    using a single suture anchor and McLaughlin type baseball stitch repair.  SURGEON:  Katy Fitch. Sypher, Montez Hageman., M.D.  ASSISTANT:  Jonni Sanger, P.A.  ANESTHESIA:  General orotracheal supplemented by interscalene block.  SUPERVISING ANESTHESIOLOGIST:  Dr. Krista Blue.  INDICATIONS FOR PROCEDURE:  Shari Golden is a 77 year old woman who was referred for evaluation of chronic left shoulder pain.  Her primary care physician, Dr. [place] Nash in [place], [place] had obtained an MRI demonstrating signs of severe rotator cuff tendinopathy and a possible full thickness supraspinatus tear.  Clinical examination confirmed AC arthropathy, a prominent anterolateral acromion and very necrotic-appearing supraspinatus and infraspinatus tendon insertion.  The tendon was very thickened and appeared to have at least  90% deep surface tear.  Arrangements were made for diagnostic arthroscopy followed by appropriate intervention anticipating a probable open repair and distal clavicle resection.  DESCRIPTION OF PROCEDURE:  Shari Golden was brought to the operating room and placed in supine position on the operating table.  Interscalene block placed in the holding area led to satisfactory anesthesia of the left forequarter.  Following induction of general endotracheal anesthesia, she was carefully positioned in beach chair position with the aid of a torso and head holder designed for shoulder arthroscopy.  The entire left upper extremity and forequarter were prepped with DuraPrep and draped with impervious arthroscopy drapes.  The arthroscope was introduced through a standard posterior portal with blunt technique.  Diagnostic arthroscopy revealed a very satisfactory appearing hyaline articular cartilage surfaces on the glenoid and humeral head.  The labrum had minimal degenerative change between 1 oclock and approximately 3 oclock anteriorly.  The anterior glenohumeral ligaments were intact.  The subscapularis was normal in appearance.  The long head of the biceps was normal as was its anchor at the labrum.  The superior recess was normal.  The inferior recess was normal.  The deep surface of the supraspinatus was very necrotic with a shaggy tear obscuring visualization of its insertion.  The infraspinatus also had a significant tear at its anterior half.  The teres minor was not well visualized but on the MRI appeared normal.  The arthroscope was then removed form the glenohumeral joint and placed in the subacromial space.  A florid bursitis was noted.  A suction shaver was brought in laterally and a bursectomy completed to review the rotator  cuff anatomy.  The rotator cuff was markedly thickened and fibrillated consistent with severe tendinopathy/tendinosis.  The anterior acromion was leveled to a  type 1 morphology and the coracoacromial ligament was released.  The scope was then removed from the subacromial space and we proceeded directly to open repair of the rotator cuff.  An incision measuring 8 cm in length was fashioned from the distal clavicle to the anterior middle thirds of the deltoid.  Subcutaneous tissues were carefully divided revealing the acromion.  The distal clavicle was exposed through a T-shaped incision with the use of periosteal elevators to expose the distal 2 cm of clavicle.  The distal 15 mm of clavicle was removed with an oscillating saw.  All remnants of the meniscus were removed with a rongeur.  The coracoacromial ligament was excised and electrocauterized for hemostasis followed by bursectomy to reveal the entire cuff.  The anterior and middle thirds of the deltoid were split revealing the entire rotator cuff anatomy.  There was a very necrotic-appearing cuff with hemorrhagic bursitis overlying it.  The anterior acromion was leveled to a type 1 morphology with the use of a power bur and an oscillating saw.  Mesial osteophyte at the [facility] joint was removed.  The tendon of the rotator cuff was split between the infraspinatus and supraspinatus longitudinally and its deep surface examined.  There was a 90% tear of the deep surface of the supraspinatus and approximately 80% tear of the infraspinatus. There was a 90% tear of the deep surface of the supraspinatus and approximately 80% tear of the infraspinatus.  Sections of both tendons were removed for biopsy.  A rongeur was used to debride all fibrillated and necrotic-appearing tendon.  The tendon was thickened to approximately twice its normal transverse dimension due to a chronic tendinosis.  The greater tuberosity was then decorticated with power bur and an absorbable suture anchor placed with standard technique.  Two mattress sutures were used to close the supraspinatus and infraspinatus directly onto  the decorticated portion of the greater tuberosity followed by use of a baseball type stitch with the knot inverted closing the longitudinal split in the tendon.  All  sutures were buried within the substance of the tendon.  The subacromial space was then thoroughly lavaged with sterile saline followed by repair of the deltoid to the trapezius muscle to close the dead space created by the distal clavicle resection.  The anterior third of the deltoid was then repaired to the acromion with multiple mattress sutures of #2 Ethibond.  The deltoid split was repaired with mattress suture of 0 Vicryl.  The wound was then thoroughly lavaged with sterile saline and closed with subdermal sutures of 2-0 Vicryl followed by intradermal 3-0 Prolene and Steri-Strips.  There were no apparent complications.  The patient tolerated the surgery and anesthesia well and was transferred to the recovery room with stable vital signs.  She will be [facility] for observation of her vital signs and appropriate analgesics in the form of PCA morphine and antibiotic in the form of IV vancomycin. DD:  03/18/01 TD:  03/18/01 Job: [id number]

## 2011-07-30 ENCOUNTER — Encounter: Payer: Self-pay | Admitting: Vascular Surgery

## 2011-08-27 ENCOUNTER — Encounter: Payer: Self-pay | Admitting: Vascular Surgery

## 2011-08-28 ENCOUNTER — Ambulatory Visit: Payer: Self-pay | Admitting: Vascular Surgery

## 2011-08-28 ENCOUNTER — Other Ambulatory Visit: Payer: Medicare Other

## 2011-09-20 ENCOUNTER — Encounter: Payer: Self-pay | Admitting: Vascular Surgery

## 2011-10-23 ENCOUNTER — Ambulatory Visit: Payer: Self-pay | Admitting: Vascular Surgery

## 2011-10-23 ENCOUNTER — Other Ambulatory Visit: Payer: Medicare Other

## 2011-10-24 ENCOUNTER — Encounter: Payer: Self-pay | Admitting: Cardiology

## 2011-10-24 DIAGNOSIS — I340 Nonrheumatic mitral (valve) insufficiency: Secondary | ICD-10-CM | POA: Insufficient documentation

## 2011-10-24 DIAGNOSIS — N183 Chronic kidney disease, stage 3 (moderate): Secondary | ICD-10-CM

## 2011-10-24 DIAGNOSIS — R9430 Abnormal result of cardiovascular function study, unspecified: Secondary | ICD-10-CM | POA: Insufficient documentation

## 2011-10-24 DIAGNOSIS — R609 Edema, unspecified: Secondary | ICD-10-CM | POA: Insufficient documentation

## 2011-10-24 DIAGNOSIS — E119 Type 2 diabetes mellitus without complications: Secondary | ICD-10-CM | POA: Insufficient documentation

## 2011-10-24 DIAGNOSIS — I253 Aneurysm of heart: Secondary | ICD-10-CM | POA: Insufficient documentation

## 2011-10-24 DIAGNOSIS — I739 Peripheral vascular disease, unspecified: Secondary | ICD-10-CM

## 2011-10-24 DIAGNOSIS — IMO0002 Reserved for concepts with insufficient information to code with codable children: Secondary | ICD-10-CM | POA: Insufficient documentation

## 2011-10-24 DIAGNOSIS — I10 Essential (primary) hypertension: Secondary | ICD-10-CM | POA: Insufficient documentation

## 2011-10-24 DIAGNOSIS — G629 Polyneuropathy, unspecified: Secondary | ICD-10-CM | POA: Insufficient documentation

## 2011-10-24 DIAGNOSIS — R0789 Other chest pain: Secondary | ICD-10-CM | POA: Insufficient documentation

## 2011-10-24 DIAGNOSIS — F419 Anxiety disorder, unspecified: Secondary | ICD-10-CM | POA: Insufficient documentation

## 2011-10-24 DIAGNOSIS — K219 Gastro-esophageal reflux disease without esophagitis: Secondary | ICD-10-CM | POA: Insufficient documentation

## 2011-10-24 DIAGNOSIS — I4891 Unspecified atrial fibrillation: Secondary | ICD-10-CM | POA: Insufficient documentation

## 2011-10-26 ENCOUNTER — Encounter: Payer: Self-pay | Admitting: Cardiology

## 2011-10-26 ENCOUNTER — Ambulatory Visit (INDEPENDENT_AMBULATORY_CARE_PROVIDER_SITE_OTHER): Payer: Medicare Other | Admitting: Cardiology

## 2011-10-26 VITALS — BP 152/77 | HR 75 | Ht 70.0 in | Wt 158.0 lb

## 2011-10-26 DIAGNOSIS — R079 Chest pain, unspecified: Secondary | ICD-10-CM

## 2011-10-26 DIAGNOSIS — I340 Nonrheumatic mitral (valve) insufficiency: Secondary | ICD-10-CM

## 2011-10-26 DIAGNOSIS — I059 Rheumatic mitral valve disease, unspecified: Secondary | ICD-10-CM

## 2011-10-26 DIAGNOSIS — R609 Edema, unspecified: Secondary | ICD-10-CM

## 2011-10-26 DIAGNOSIS — I10 Essential (primary) hypertension: Secondary | ICD-10-CM

## 2011-10-26 NOTE — Patient Instructions (Signed)
Your physician you to follow up in 1 year. You will receive a reminder letter in the mail one-two months in advance. If you don't receive a letter, please call our office to schedule the follow-up appointment. Your physician recommends that you continue on your current medications as directed. Please refer to the Current Medication list given to you today. 

## 2011-10-26 NOTE — Progress Notes (Signed)
HPI   Patient is seen today for followup her history of chest pain and some paroxysmal atrial fibrillation in the past.  I saw her last in November, 2011.  She had a good year.  She's not having any chest pain or shortness of breath.   As part of today's evaluation I have fully updated patient's new EMR record.  I reviewed all of her old records.  Allergies  Allergen Reactions  . Erythromycin     REACTION: Dehydration  . Iodine     REACTION: rash, dizzy  . Iohexol      Code: HIVES, Desc: IODINE BASED CONTRAST   . Latex     REACTION: Swelling (If touched on face will have facial swelling)  . Penicillins     REACTION: Rash    Current Outpatient Prescriptions  Medication Sig Dispense Refill  . aspirin 81 MG EC tablet Take 81 mg by mouth daily.        . benazepril (LOTENSIN) 40 MG tablet Take 40 mg by mouth daily.       . calcium carbonate (OS-CAL) 600 MG TABS Take 600 mg by mouth daily.        Marland Kitchen diltiazem (CARDIZEM CD) 360 MG 24 hr capsule Take 360 mg by mouth daily.        . fish oil-omega-3 fatty acids 1000 MG capsule Take 1,200 mg by mouth daily.        . furosemide (LASIX) 40 MG tablet Take 20 mg by mouth daily.        . insulin glargine (LANTUS) 100 UNIT/ML injection Inject into the skin as needed.       Marland Kitchen LORazepam (ATIVAN) 0.5 MG tablet Take 0.5 mg by mouth every 8 (eight) hours as needed.       . Multiple Vitamin (MULTIVITAMIN) capsule Take 1 capsule by mouth daily.        . multivitamin (RENA-VIT) TABS tablet Take 1 tablet by mouth daily.        . pantoprazole (PROTONIX) 40 MG tablet Take 40 mg by mouth daily.        . simvastatin (ZOCOR) 20 MG tablet Take 20 mg by mouth at bedtime.          History   Social History  . Marital Status: Widowed    Spouse Name: N/A    Number of Children: N/A  . Years of Education: N/A   Occupational History  . Not on file.   Social History Main Topics  . Smoking status: Former Smoker    Types: Cigarettes    Quit date: 12/10/1954    . Smokeless tobacco: Never Used  . Alcohol Use: No  . Drug Use: No  . Sexually Active: Not on file   Other Topics Concern  . Not on file   Social History Narrative  . No narrative on file    Family History  Problem Relation Age of Onset  . Diabetes Sister   . Stroke Neg Hx   . Coronary artery disease Neg Hx     Past Medical History  Diagnosis Date  . Diabetes mellitus   . Hypertension   . Anemia   . Constipation 07/18/2009  . Chronic kidney disease   . GERD (gastroesophageal reflux disease)   . Peripheral neuropathy   . Carotid artery disease     Dr. Hart Rochester, consult done by him August, 2010, his protocol to follow the patient  . Chest pain     2004, catheterization, normal  coronaries /  nuclear May, 2010, no ischemia atrial fibrillation  . Atrial fibrillation     ??? In the past, not a recurring problem.  . Ejection fraction     EF 60-65%, echo, June, 2010  . Mitral regurgitation     Mild, echo, 2010  . Atrial septal aneurysm     Echo, 2010   . Edema     Chronic  . Anxiety     Past Surgical History  Procedure Date  . Abdominal hysterectomy   . Appendectomy   . Carpal tunnel release     bilateral  . Shoulder surgery     left rotator cuff  . Elbow surgery     bilateral    ROS    Patient denies fever, chills, headache, sweats, rash, change in vision, change in hearing, chest pain, cough, nausea vomiting, urinary symptoms.  All other systems are reviewed and are negative.  PHYSICAL EXAM    Patient is here with her daughter.  She looks quite good.  She is oriented to person time and place.  Affect is normal.  There is no jugular venous distention.  She has no xanthelasma.  Lungs are clear.  Respiratory effort is nonlabored.  Cardiac exam reveals S1 and S2.  There are no clicks or significant murmurs.  The abdomen is soft.  She has no significant peripheral edema.  There are no skin rashes.  There are no musculoskeletal deformities.  Filed Vitals:   10/26/11  0923  BP: 152/77  Pulse: 75  Height: 5\' 10"  (1.778 m)  Weight: 158 lb (71.668 kg)    EKG   EKG is done today and reviewed by me.  She does have normal sinus rhythm.  There is decreased R wave in lead V2.  There is no significant change.  ASSESSMENT & PLAN

## 2011-10-26 NOTE — Assessment & Plan Note (Signed)
Her systolic blood pressure is mildly elevated today.  Her blood pressures at home have ranged in the 130 systolic range.  I feel we should not change her medicines.

## 2011-10-26 NOTE — Assessment & Plan Note (Signed)
Historically she's had mild mitral regurgitation. She does not need a followup echo at this time. 

## 2011-10-26 NOTE — Assessment & Plan Note (Signed)
She has not had any recurrent significant chest pain.  He noted from 2004 for catheterization showed no significant coronary.  No further workup is needed.

## 2011-10-26 NOTE — Assessment & Plan Note (Signed)
Historically she has had some mild edema.  She does not have any at this time.  No change in therapy

## 2011-11-05 ENCOUNTER — Encounter: Payer: Self-pay | Admitting: Vascular Surgery

## 2011-11-06 ENCOUNTER — Ambulatory Visit (INDEPENDENT_AMBULATORY_CARE_PROVIDER_SITE_OTHER): Payer: Medicare Other | Admitting: *Deleted

## 2011-11-06 ENCOUNTER — Ambulatory Visit (INDEPENDENT_AMBULATORY_CARE_PROVIDER_SITE_OTHER): Payer: Medicare Other | Admitting: Vascular Surgery

## 2011-11-06 ENCOUNTER — Encounter: Payer: Self-pay | Admitting: Vascular Surgery

## 2011-11-06 VITALS — BP 151/42 | HR 69 | Resp 18 | Ht 74.0 in | Wt 157.0 lb

## 2011-11-06 DIAGNOSIS — I6529 Occlusion and stenosis of unspecified carotid artery: Secondary | ICD-10-CM

## 2011-11-06 NOTE — Progress Notes (Signed)
Subjective:     Patient ID: Shari Golden, female   DOB: 27-Jun-1930, 77 y.o.   MRN: [id number]  HPI this 77 year old female returns for followup regarding her carotid occlusive disease which was discovered 2 years ago on ultrasound study done at [facility]. The patient has some occasional falling spells but no history of stroke, lateralizing weakness, amaurosis fugax, diplopia, blurred vision, or syncope. Last year ultrasound at our office revealed no significant flow reduction in either carotid artery  Past Medical History  Diagnosis Date  . Diabetes mellitus   . Hypertension   . Anemia   . Constipation 07/18/2009  . Chronic kidney disease   . GERD (gastroesophageal reflux disease)   . Peripheral neuropathy   . Carotid artery disease     Dr. Hart Rochester, consult done by him August, 2010, his protocol to follow the patient  . Chest pain     2004, catheterization, normal coronaries /  nuclear May, 2010, no ischemia atrial fibrillation  . Atrial fibrillation     ??? In the past, not a recurring problem.  . Ejection fraction     EF 60-65%, echo, June, 2010  . Mitral regurgitation     Mild, echo, 2010  . Atrial septal aneurysm     Echo, 2010   . Edema     Chronic  . Anxiety     History  Substance Use Topics  . Smoking status: Former Smoker -- 3 years    Types: Cigarettes    Quit date: 12/10/1954  . Smokeless tobacco: Never Used  . Alcohol Use: No    Family History  Problem Relation Age of Onset  . Diabetes Sister   . Stroke Neg Hx   . Coronary artery disease Neg Hx     Allergies  Allergen Reactions  . Erythromycin     REACTION: Dehydration  . Iodine     REACTION: rash, dizzy  . Iohexol      Code: HIVES, Desc: IODINE BASED CONTRAST   . Latex     REACTION: Swelling (If touched on face will have facial swelling)  . Penicillins     REACTION: Rash    Current outpatient prescriptions:aspirin 81 MG EC tablet, Take 81 mg by mouth daily.  , Disp: , Rfl: ;  benazepril  (LOTENSIN) 40 MG tablet, Take 40 mg by mouth daily. , Disp: , Rfl: ;  calcium carbonate (OS-CAL) 600 MG TABS, Take 600 mg by mouth daily.  , Disp: , Rfl: ;  diltiazem (TIAZAC) 360 MG 24 hr capsule, Take 360 mg by mouth daily.  , Disp: , Rfl: ;  fish oil-omega-3 fatty acids 1000 MG capsule, Take 1,200 mg by mouth daily.  , Disp: , Rfl:  furosemide (LASIX) 40 MG tablet, Take 20 mg by mouth daily.  , Disp: , Rfl: ;  insulin glargine (LANTUS) 100 UNIT/ML injection, Inject into the skin as needed. , Disp: , Rfl: ;  LORazepam (ATIVAN) 0.5 MG tablet, Take 0.5 mg by mouth every 8 (eight) hours as needed. , Disp: , Rfl: ;  Multiple Vitamin (MULTIVITAMIN) capsule, Take 1 capsule by mouth daily.  , Disp: , Rfl:  multivitamin (RENA-VIT) TABS tablet, Take 1 tablet by mouth daily.  , Disp: , Rfl: ;  pantoprazole (PROTONIX) 40 MG tablet, Take 40 mg by mouth daily.  , Disp: , Rfl: ;  simvastatin (ZOCOR) 20 MG tablet, Take 20 mg by mouth at bedtime.  , Disp: , Rfl: ;  diltiazem (CARDIZEM CD) 360  MG 24 hr capsule, Take 360 mg by mouth daily.  , Disp: , Rfl:   BP 151/42  Pulse 69  Resp 18  Ht 6\' 2"  (1.88 m)  Wt 157 lb (71.215 kg)  BMI 20.16 kg/m2  Body mass index is 20.16 kg/(m^2).        Review of Systems patient denies chest pain, dyspnea on exertion, PND, orthopnea, hemoptysis, claudication. She does have occasional falling spells. Also occasional reflux esophagitis. Denies other symptoms in the complete review of systems    Objective:   Physical Exam Blood pressure 151/42 heart rate 69 respirations 18 General she is an elderly female no apparent stress alert and oriented x3 HEENT normal for age Lungs no rhonchi or wheezing Cardiovascular regular rhythm no murmurs carotid pulses 3+ with soft bruits bilaterally Neurologic normal Abdomen soft nontender with no masses 3+ femoral and posterior tibial pulses palpable bilaterally.  Data ordered a carotid duplex exam which I reviewed and interpreted. She  has very minimal flow reduction bilaterally in her internal carotid arteries    Assessment:     Minimal bilateral internal carotid flow reduction-asymptomatic with the exception of occasional falling spells.    Plan:     Return in one year for followup carotid duplex exam unless she develops specific neurologic symptoms in the interim

## 2011-11-12 NOTE — Procedures (Unsigned)
CAROTID DUPLEX EXAM  INDICATION:  Carotid disease.  HISTORY: Diabetes:  Yes. Cardiac:  No. Hypertension:  Yes. Smoking:  Previous. Previous Surgery:  None. CV History:  Asymptomatic. Amaurosis Fugax No, Paresthesias No, Hemiparesis No.                                      RIGHT             LEFT Brachial systolic pressure:         140               144 Brachial Doppler waveforms:         Normal            Normal Vertebral direction of flow:        Antegrade         Antegrade DUPLEX VELOCITIES (cm/sec) CCA peak systolic                   89                118 ECA peak systolic                   93                95 ICA peak systolic                   103               114 ICA end diastolic                   25                31 PLAQUE MORPHOLOGY:                  Heterogenous      Heterogenous PLAQUE AMOUNT:                      Minimal           Minimal PLAQUE LOCATION:                    ICA, ECA          ICA, CCA   IMPRESSION: 1. No evidence of hemodynamically significant stenosis of the     bilateral internal carotid arteries. 2. Stable in comparison to the last examination.         ___________________________________________ Quita Skye Hart Rochester, M.D.  EM/MEDQ  D:  11/07/2011  T:  11/07/2011  Job:  [id number]

## 2012-10-29 ENCOUNTER — Other Ambulatory Visit: Payer: Self-pay | Admitting: *Deleted

## 2012-10-29 DIAGNOSIS — I6529 Occlusion and stenosis of unspecified carotid artery: Secondary | ICD-10-CM

## 2012-11-03 ENCOUNTER — Encounter: Payer: Self-pay | Admitting: Neurosurgery

## 2012-11-04 ENCOUNTER — Ambulatory Visit (INDEPENDENT_AMBULATORY_CARE_PROVIDER_SITE_OTHER): Payer: Medicare Other | Admitting: Neurosurgery

## 2012-11-04 ENCOUNTER — Other Ambulatory Visit (INDEPENDENT_AMBULATORY_CARE_PROVIDER_SITE_OTHER): Payer: Medicare Other | Admitting: *Deleted

## 2012-11-04 ENCOUNTER — Encounter: Payer: Self-pay | Admitting: Neurosurgery

## 2012-11-04 VITALS — BP 138/63 | HR 57 | Resp 16 | Ht 71.0 in | Wt 157.8 lb

## 2012-11-04 DIAGNOSIS — I6529 Occlusion and stenosis of unspecified carotid artery: Secondary | ICD-10-CM

## 2012-11-04 NOTE — Progress Notes (Signed)
[facility] [place] Carotid Office Note  CC: Carotid surveillance Referring Physician: Hart Rochester  History of Present Illness: 78 year old female patient of Dr. Hart Rochester with no history of carotid intervention. The patient denies any signs or symptoms of CVA, TIA, amaurosis fugax or any neural deficit. The patient denies any new medical diagnoses or recent surgery  Past Medical History  Diagnosis Date  . Diabetes mellitus   . Hypertension   . Anemia   . Constipation 07/18/2009  . Chronic kidney disease   . GERD (gastroesophageal reflux disease)   . Peripheral neuropathy   . Carotid artery disease     Dr. Hart Rochester, consult done by him August, 2010, his protocol to follow the patient  . Chest pain     2004, catheterization, normal coronaries /  nuclear May, 2010, no ischemia atrial fibrillation  . Atrial fibrillation     ??? In the past, not a recurring problem.  . Ejection fraction     EF 60-65%, echo, June, 2010  . Mitral regurgitation     Mild, echo, 2010  . Atrial septal aneurysm     Echo, 2010   . Edema     Chronic  . Anxiety     ROS: [x]  Positive   [ ]  Denies    General: [ ]  Weight loss, [ ]  Fever, [ ]  chills Neurologic: [ ]  Dizziness, [ ]  Blackouts, [ ]  Seizure [ ]  Stroke, [ ]  "Mini stroke", [ ]  Slurred speech, [ ]  Temporary blindness; [ ]  weakness in arms or legs, [ ]  Hoarseness Cardiac: [ ]  Chest pain/pressure, [ ]  Shortness of breath at rest [ ]  Shortness of breath with exertion, [ ]  Atrial fibrillation or irregular heartbeat Vascular: [ ]  Pain in legs with walking, [ ]  Pain in legs at rest, [ ]  Pain in legs at night,  [ ]  Non-healing ulcer, [ ]  Blood clot in vein/DVT,   Pulmonary: [ ]  Home oxygen, [ ]  Productive cough, [ ]  Coughing up blood, [ ]  Asthma,  [ ]  Wheezing Musculoskeletal:  [ ]  Arthritis, [ ]  Low back pain, [ ]  Joint pain Hematologic: [ ]  Easy Bruising, [ ]  Anemia; [ ]  Hepatitis Gastrointestinal: [ ]  Blood in stool, [ ]  Gastroesophageal  Reflux/heartburn, [ ]  Trouble swallowing Urinary: [ ]  chronic Kidney disease, [ ]  on HD - [ ]  MWF or [ ]  TTHS, [ ]  Burning with urination, [ ]  Difficulty urinating Skin: [ ]  Rashes, [ ]  Wounds Psychological: [ ]  Anxiety, [ ]  Depression   Social History History  Substance Use Topics  . Smoking status: Former Smoker -- 3 years    Types: Cigarettes    Quit date: 12/10/1954  . Smokeless tobacco: Never Used  . Alcohol Use: No    Family History Family History  Problem Relation Age of Onset  . Diabetes Sister   . Stroke Neg Hx   . Coronary artery disease Neg Hx     Allergies  Allergen Reactions  . Erythromycin     REACTION: Dehydration  . Iodine     REACTION: rash, dizzy  . Iohexol      Code: HIVES, Desc: IODINE BASED CONTRAST   . Latex     REACTION: Swelling (If touched on face will have facial swelling)  . Penicillins     REACTION: Rash    Current Outpatient Prescriptions  Medication Sig Dispense Refill  . polyethylene glycol powder (GLYCOLAX/MIRALAX) powder       . aspirin 81 MG EC  tablet Take 81 mg by mouth daily.        . benazepril (LOTENSIN) 40 MG tablet Take 40 mg by mouth daily.       . calcium carbonate (OS-CAL) 600 MG TABS Take 600 mg by mouth daily.        Marland Kitchen diltiazem (CARDIZEM CD) 360 MG 24 hr capsule Take 360 mg by mouth daily.        Marland Kitchen diltiazem (TIAZAC) 360 MG 24 hr capsule Take 360 mg by mouth daily.        . fish oil-omega-3 fatty acids 1000 MG capsule Take 1,200 mg by mouth daily.        . furosemide (LASIX) 40 MG tablet Take 20 mg by mouth daily.        . insulin glargine (LANTUS) 100 UNIT/ML injection Inject into the skin as needed.       Marland Kitchen LORazepam (ATIVAN) 0.5 MG tablet Take 0.5 mg by mouth every 8 (eight) hours as needed.       . Multiple Vitamin (MULTIVITAMIN) capsule Take 1 capsule by mouth daily.        . multivitamin (RENA-VIT) TABS tablet Take 1 tablet by mouth daily.        . pantoprazole (PROTONIX) 40 MG tablet Take 40 mg by mouth daily.         Marland Kitchen PATADAY 0.2 % SOLN       . simvastatin (ZOCOR) 20 MG tablet Take 20 mg by mouth at bedtime.          Physical Examination  Filed Vitals:   11/04/12 1128  BP: 138/63  Pulse: 57  Resp:     Body mass index is 22.01 kg/(m^2).  General:  WDWN in NAD Gait: Normal HEENT: WNL Eyes: Pupils equal Pulmonary: normal non-labored breathing , without Rales, rhonchi,  wheezing Cardiac: RRR, without  Murmurs, rubs or gallops; Abdomen: soft, NT, no masses Skin: no rashes, ulcers noted  Vascular Exam Pulses: 3+ radial pulses bilaterally Carotid bruits: Carotid pulses to auscultation no bruits are heard Extremities without ischemic changes, no Gangrene , no cellulitis; no open wounds;  Musculoskeletal: no muscle wasting or atrophy   Neurologic: A&O X 3; Appropriate Affect ; SENSATION: normal; MOTOR FUNCTION:  moving all extremities equally. Speech is fluent/normal  Non-Invasive Vascular Imaging CAROTID DUPLEX 11/04/2012  Right ICA 20 - 39 % stenosis Left ICA 20 - 39 % stenosis   ASSESSMENT/PLAN: Asymptomatic patient with unchanged exam from one year ago. The patient will followup in one year with repeat carotid duplex. The patient's questions were encouraged and answered, she is in agreement with this plan.  Lauree Chandler ANP   Clinic MD: Hart Rochester

## 2013-03-03 ENCOUNTER — Ambulatory Visit: Payer: Medicare Other | Attending: Orthopedic Surgery | Admitting: Physical Therapy

## 2013-03-03 DIAGNOSIS — R5381 Other malaise: Secondary | ICD-10-CM | POA: Insufficient documentation

## 2013-03-03 DIAGNOSIS — Z9181 History of falling: Secondary | ICD-10-CM | POA: Insufficient documentation

## 2013-03-03 DIAGNOSIS — IMO0001 Reserved for inherently not codable concepts without codable children: Secondary | ICD-10-CM | POA: Insufficient documentation

## 2013-03-03 DIAGNOSIS — M545 Low back pain, unspecified: Secondary | ICD-10-CM | POA: Insufficient documentation

## 2013-03-10 ENCOUNTER — Ambulatory Visit: Payer: Medicare Other | Attending: Orthopedic Surgery | Admitting: Physical Therapy

## 2013-03-10 DIAGNOSIS — M545 Low back pain, unspecified: Secondary | ICD-10-CM | POA: Insufficient documentation

## 2013-03-10 DIAGNOSIS — IMO0001 Reserved for inherently not codable concepts without codable children: Secondary | ICD-10-CM | POA: Insufficient documentation

## 2013-03-10 DIAGNOSIS — R5381 Other malaise: Secondary | ICD-10-CM | POA: Insufficient documentation

## 2013-03-12 ENCOUNTER — Ambulatory Visit: Payer: Medicare Other | Admitting: Physical Therapy

## 2013-03-18 ENCOUNTER — Ambulatory Visit: Payer: Medicare Other | Admitting: Physical Therapy

## 2013-03-20 ENCOUNTER — Ambulatory Visit: Payer: Medicare Other | Admitting: *Deleted

## 2013-03-23 ENCOUNTER — Encounter (HOSPITAL_COMMUNITY): Payer: Self-pay

## 2013-03-23 ENCOUNTER — Ambulatory Visit: Payer: Medicare Other | Admitting: Physical Therapy

## 2013-03-25 ENCOUNTER — Ambulatory Visit: Payer: Medicare Other | Admitting: *Deleted

## 2013-03-26 ENCOUNTER — Encounter (HOSPITAL_COMMUNITY)
Admission: RE | Admit: 2013-03-26 | Discharge: 2013-03-26 | Disposition: A | Discharge status: Home / Self Care | Payer: Medicare Other | Source: Ambulatory Visit | Attending: Orthopedic Surgery | Admitting: Orthopedic Surgery

## 2013-03-26 ENCOUNTER — Encounter (HOSPITAL_COMMUNITY): Payer: Self-pay | Admitting: *Deleted

## 2013-03-26 ENCOUNTER — Encounter (HOSPITAL_COMMUNITY)
Admission: RE | Admit: 2013-03-26 | Discharge: 2013-03-26 | Disposition: A | Discharge status: Home / Self Care | Payer: Medicare Other | Source: Ambulatory Visit | Attending: Anesthesiology | Admitting: Anesthesiology

## 2013-03-26 LAB — SURGICAL PCR SCREEN
MRSA, PCR: NEGATIVE
Staphylococcus aureus: NEGATIVE

## 2013-03-26 NOTE — Pre-Procedure Instructions (Addendum)
ARIELLA VOIT  03/26/2013   Your procedure is scheduled on:  Wednesday, April 23rd.  Report to [facility] at 10:30AM.  Call this number if you have problems the morning of surgery: [phone]   Remember:   Do not eat food or drink liquids after midnight.   Take these medicines the morning of surgery with A SIP OF WATER:  diltiazem (TIAZAC)  LORazepam (ATIVAN)  pantoprazole (PROTONIX)  May use eye drops.  Stop taking Aspirin, Coumadin, Plavix, Effient and Herbal medications.  Do not take any NSAIDs ie: Ibuprofen,  Advil,Naproxen or any medication containing Aspirin.   Do not wear jewelry, make-up or nail polish.  Do not wear lotions, powders, or perfumes. You may wear deodorant.  Do not shave 48 hours prior to surgery.   Do not bring valuables to the hospital.  Contacts, dentures or bridgework may not be worn into surgery.  Leave suitcase in the car. After surgery it may be brought to your room.  For patients admitted to the hospital, checkout time is 11:00 AM the day of discharge.   Patients discharged the day of surgery will not be allowed to drive home.  Name and phone number of your driver: -  Special Instructions: Shower using CHG 2 nights before surgery and the night before surgery.  If you shower the day of surgery use CHG.  Use special wash - you have one bottle of CHG for all showers.  You should use approximately 1/3 of the bottle for each shower.   Please read over the following fact sheets that you were given: Pain Booklet, Coughing and Deep Breathing and Surgical Site Infection Prevention

## 2013-03-27 ENCOUNTER — Encounter (HOSPITAL_COMMUNITY): Payer: Self-pay

## 2013-03-27 NOTE — Progress Notes (Signed)
Anesthesia Chart Review:  Patient is a 79 year old female scheduled for right wrist arthrodesis with possible local bone graft by Dr. Melvyn Novas on 04/01/13.  History includes former smoker, DM2 with peripheral neuropathy, HTN, CKD stage III (Nephrologist is Dr. Kristian Covey), anemia, GERD, paroxysmal afib '10, atrial septal aneurysm, anxiety, chest pain in 2004 with reported "normal" coronaries by cath (no copy found), mild MR by echo '10, 1-39% bilateral ICA stenosis 10/2012, chronic LE edema.  She has been seen by cardiologist Dr. Myrtis Ser in the past, last in 10/2011.  He did not recommend further cardiac work-up at that time (see note in Epic).  EKG on 03/26/13 showed SR with sinus arrhythmia, nonspecific T wave abnormality.  She had a stress and echo at [facility] on 04/21/09.  Stress showed probable normal exercise thallium as outlined. There were equivocal ST segment changes noted at peak in leads V5 and V6, although in the absence of chest pain and also with significant hypertensive response. Perfusion data shows evidence of breast attenuation and possibly variable soft tissue attenuation affecting the basal septum. No large reversible defects are identified. Left ventricular ejection fraction is normal at 61%. Echo showed mild concentric LVH. Basal septal hypertrophy. Global left ventricular wall motion and contractility are within normal limits. Estimated EF 60-65%, abnormal left ventricular diastolic filling consistent with impaired relaxation. Mildly dilated left atrium. Mild mitral regurgitation. Mild aortic leaflet calcification. Mild tricuspid regurgitation. RVSP 30 mm Hg. No pericardial effusion. Evidence of atrial septal aneurysm. (Records are scanned under Media tab.)  CXR on 03/26/13 showed no acute cardiopulmonary disease.  She had labs drawn at [facility] on 03/26/13 that showed glucose 187, BUN 50, Cr 2.39, Na 140, K 4.8, H/H 10.2/30.7.  A PLT count was not done.  (I  called Dr. Carlynn Spry office and was told patient's baseline Cr over the past six months has been 1.7 - 2.0.  Hgb has also been ~ 10.  Office staff will fax additional records).  Patient is scheduled for a CBC on the day of surgery.  I'll also order a hold clot--her anesthesiologist or surgeon can decide if it should progress to a T&S or T&C depending on her follow-up H/H results.    [name] [facility]/Anesthesiology Phone [phone] 03/27/2013 11:00 AM

## 2013-03-31 ENCOUNTER — Ambulatory Visit: Payer: Medicare Other | Admitting: Physical Therapy

## 2013-04-01 ENCOUNTER — Ambulatory Visit (HOSPITAL_COMMUNITY): Payer: Medicare Other | Admitting: Anesthesiology

## 2013-04-01 ENCOUNTER — Encounter (HOSPITAL_COMMUNITY): Payer: Self-pay | Admitting: Vascular Surgery

## 2013-04-01 ENCOUNTER — Encounter (HOSPITAL_COMMUNITY)
Admission: RE | Disposition: A | Discharge status: Home / Self Care | Payer: Self-pay | Source: Ambulatory Visit | Attending: Orthopedic Surgery

## 2013-04-01 ENCOUNTER — Observation Stay (HOSPITAL_COMMUNITY)
Admission: RE | Admit: 2013-04-01 | Discharge: 2013-04-02 | Disposition: A | Discharge status: Home / Self Care | Payer: Medicare Other | Source: Ambulatory Visit | Attending: Orthopedic Surgery | Admitting: Orthopedic Surgery

## 2013-04-01 ENCOUNTER — Encounter (HOSPITAL_COMMUNITY): Payer: Self-pay | Admitting: *Deleted

## 2013-04-01 DIAGNOSIS — I129 Hypertensive chronic kidney disease with stage 1 through stage 4 chronic kidney disease, or unspecified chronic kidney disease: Secondary | ICD-10-CM | POA: Insufficient documentation

## 2013-04-01 DIAGNOSIS — Z01810 Encounter for preprocedural cardiovascular examination: Secondary | ICD-10-CM | POA: Insufficient documentation

## 2013-04-01 DIAGNOSIS — E119 Type 2 diabetes mellitus without complications: Secondary | ICD-10-CM | POA: Insufficient documentation

## 2013-04-01 DIAGNOSIS — G609 Hereditary and idiopathic neuropathy, unspecified: Secondary | ICD-10-CM | POA: Insufficient documentation

## 2013-04-01 DIAGNOSIS — M19039 Primary osteoarthritis, unspecified wrist: Principal | ICD-10-CM | POA: Insufficient documentation

## 2013-04-01 DIAGNOSIS — N189 Chronic kidney disease, unspecified: Secondary | ICD-10-CM | POA: Insufficient documentation

## 2013-04-01 DIAGNOSIS — Z01818 Encounter for other preprocedural examination: Secondary | ICD-10-CM | POA: Insufficient documentation

## 2013-04-01 DIAGNOSIS — M9380 Other specified osteochondropathies of unspecified site: Secondary | ICD-10-CM | POA: Insufficient documentation

## 2013-04-01 DIAGNOSIS — Z01812 Encounter for preprocedural laboratory examination: Secondary | ICD-10-CM | POA: Insufficient documentation

## 2013-04-01 HISTORY — PX: WRIST FUSION: SHX839

## 2013-04-01 HISTORY — DX: Unspecified osteoarthritis, unspecified site: M19.90

## 2013-04-01 HISTORY — DX: Type 2 diabetes mellitus without complications: E11.9

## 2013-04-01 LAB — CBC
HCT: 30.7 % — ABNORMAL LOW (ref 36.0–46.0)
Hemoglobin: 10.4 g/dL — ABNORMAL LOW (ref 12.0–15.0)
MCH: 30.3 pg (ref 26.0–34.0)
MCHC: 33.9 g/dL (ref 30.0–36.0)
MCV: 89.5 fL (ref 78.0–100.0)
RBC: 3.43 MIL/uL — ABNORMAL LOW (ref 3.87–5.11)

## 2013-04-01 LAB — GLUCOSE, CAPILLARY
Glucose-Capillary: 109 mg/dL — ABNORMAL HIGH (ref 70–99)
Glucose-Capillary: 131 mg/dL — ABNORMAL HIGH (ref 70–99)

## 2013-04-01 LAB — SAMPLE TO BLOOD BANK

## 2013-04-01 SURGERY — FUSION, JOINT, WRIST
Anesthesia: Regional | Laterality: Right | Wound class: Clean

## 2013-04-01 MED ORDER — LORAZEPAM 0.5 MG PO TABS
0.5000 mg | ORAL_TABLET | Freq: Every day | ORAL | Status: DC
  Filled 2013-04-01: qty 1

## 2013-04-01 MED ORDER — CHLORHEXIDINE GLUCONATE 4 % EX LIQD: 60.0000 mL | Freq: Once | CUTANEOUS | Status: DC

## 2013-04-01 MED ORDER — VITAMIN C 500 MG PO TABS
1000.0000 mg | ORAL_TABLET | Freq: Every day | ORAL | Status: DC
  Administered 2013-04-02: 1000 mg via ORAL
  Filled 2013-04-01 (×2): qty 2

## 2013-04-01 MED ORDER — RENA-VITE PO TABS
1.0000 | ORAL_TABLET | Freq: Every day | ORAL | Status: DC
  Administered 2013-04-02: 1 via ORAL
  Filled 2013-04-01 (×2): qty 1

## 2013-04-01 MED ORDER — PROPOFOL 10 MG/ML IV BOLUS
INTRAVENOUS | Status: DC | PRN
  Administered 2013-04-01: 100 mg via INTRAVENOUS
  Administered 2013-04-01: 50 mg via INTRAVENOUS

## 2013-04-01 MED ORDER — MORPHINE SULFATE 2 MG/ML IJ SOLN
1.0000 mg | INTRAMUSCULAR | Status: DC | PRN
  Administered 2013-04-01 – 2013-04-02 (×3): 1 mg via INTRAVENOUS
  Filled 2013-04-01 (×2): qty 1

## 2013-04-01 MED ORDER — METHOCARBAMOL 500 MG PO TABS
500.0000 mg | ORAL_TABLET | Freq: Four times a day (QID) | ORAL | Status: DC | PRN
  Administered 2013-04-01 – 2013-04-02 (×2): 500 mg via ORAL
  Filled 2013-04-01 (×2): qty 1

## 2013-04-01 MED ORDER — PANTOPRAZOLE SODIUM 40 MG PO TBEC
40.0000 mg | DELAYED_RELEASE_TABLET | Freq: Every day | ORAL | Status: DC
  Administered 2013-04-01 – 2013-04-02 (×2): 40 mg via ORAL
  Filled 2013-04-01 (×2): qty 1

## 2013-04-01 MED ORDER — DOCUSATE SODIUM 100 MG PO CAPS
100.0000 mg | ORAL_CAPSULE | Freq: Two times a day (BID) | ORAL | Status: DC
  Administered 2013-04-02: 100 mg via ORAL
  Filled 2013-04-01 (×2): qty 1

## 2013-04-01 MED ORDER — ROPIVACAINE HCL 5 MG/ML IJ SOLN
INTRAMUSCULAR | Status: DC | PRN
  Administered 2013-04-01: 25 mL

## 2013-04-01 MED ORDER — INSULIN GLARGINE 100 UNIT/ML ~~LOC~~ SOLN: 10.0000 [IU] | Freq: Four times a day (QID) | SUBCUTANEOUS | Status: DC | PRN

## 2013-04-01 MED ORDER — ONDANSETRON HCL 4 MG/2ML IJ SOLN
INTRAMUSCULAR | Status: DC | PRN
  Administered 2013-04-01: 4 mg via INTRAVENOUS

## 2013-04-01 MED ORDER — VANCOMYCIN HCL IN DEXTROSE 1-5 GM/200ML-% IV SOLN
INTRAVENOUS | Status: AC
  Administered 2013-04-01: 1000 mg via INTRAVENOUS
  Filled 2013-04-01: qty 200

## 2013-04-01 MED ORDER — ONDANSETRON HCL 4 MG/2ML IJ SOLN
4.0000 mg | Freq: Four times a day (QID) | INTRAMUSCULAR | Status: DC | PRN
  Administered 2013-04-01: 4 mg via INTRAVENOUS
  Filled 2013-04-01: qty 2

## 2013-04-01 MED ORDER — CARBOXYMETHYLCELLULOSE SODIUM 0.5 % OP SOLN: 1.0000 [drp] | Freq: Three times a day (TID) | OPHTHALMIC | Status: DC | PRN

## 2013-04-01 MED ORDER — ASPIRIN 81 MG PO CHEW
81.0000 mg | CHEWABLE_TABLET | Freq: Every day | ORAL | Status: DC
  Administered 2013-04-02: 81 mg via ORAL
  Filled 2013-04-01: qty 1

## 2013-04-01 MED ORDER — OXYCODONE-ACETAMINOPHEN 5-325 MG PO TABS
1.0000 | ORAL_TABLET | ORAL | Status: DC | PRN
  Administered 2013-04-01 – 2013-04-02 (×2): 2 via ORAL
  Filled 2013-04-01 (×2): qty 2

## 2013-04-01 MED ORDER — MIDAZOLAM HCL 5 MG/5ML IJ SOLN
INTRAMUSCULAR | Status: DC | PRN
  Administered 2013-04-01: 1 mg via INTRAVENOUS
  Administered 2013-04-01: 0.5 mg via INTRAVENOUS

## 2013-04-01 MED ORDER — DIPHENHYDRAMINE HCL 25 MG PO CAPS: 25.0000 mg | ORAL_CAPSULE | Freq: Four times a day (QID) | ORAL | Status: DC | PRN

## 2013-04-01 MED ORDER — FUROSEMIDE 20 MG PO TABS
20.0000 mg | ORAL_TABLET | Freq: Every day | ORAL | Status: DC
  Filled 2013-04-01: qty 1

## 2013-04-01 MED ORDER — LIDOCAINE HCL (CARDIAC) 20 MG/ML IV SOLN
INTRAVENOUS | Status: DC | PRN
  Administered 2013-04-01: 70 mg via INTRAVENOUS

## 2013-04-01 MED ORDER — HYDROCODONE-ACETAMINOPHEN 5-325 MG PO TABS
1.0000 | ORAL_TABLET | ORAL | Status: DC | PRN
  Administered 2013-04-02: 1 via ORAL
  Filled 2013-04-01: qty 1

## 2013-04-01 MED ORDER — VANCOMYCIN HCL 500 MG IV SOLR
500.0000 mg | Freq: Once | INTRAVENOUS | Status: AC
  Administered 2013-04-02: 500 mg via INTRAVENOUS
  Filled 2013-04-01 (×2): qty 500

## 2013-04-01 MED ORDER — DILTIAZEM HCL ER COATED BEADS 360 MG PO CP24
360.0000 mg | ORAL_CAPSULE | Freq: Every day | ORAL | Status: DC
  Administered 2013-04-01 – 2013-04-02 (×2): 360 mg via ORAL
  Filled 2013-04-01 (×2): qty 1

## 2013-04-01 MED ORDER — KCL IN DEXTROSE-NACL 20-5-0.45 MEQ/L-%-% IV SOLN
INTRAVENOUS | Status: DC
  Administered 2013-04-02: 1000 mL via INTRAVENOUS
  Filled 2013-04-01 (×2): qty 1000

## 2013-04-01 MED ORDER — ONDANSETRON HCL 4 MG PO TABS
4.0000 mg | ORAL_TABLET | Freq: Four times a day (QID) | ORAL | Status: DC | PRN
  Filled 2013-04-01: qty 1

## 2013-04-01 MED ORDER — LACTATED RINGERS IV SOLN
INTRAVENOUS | Status: DC | PRN
  Administered 2013-04-01 (×2): via INTRAVENOUS

## 2013-04-01 MED ORDER — VANCOMYCIN HCL 10 G IV SOLR: 1250.0000 mg | Freq: Once | INTRAVENOUS | Status: DC

## 2013-04-01 MED ORDER — VANCOMYCIN HCL IN DEXTROSE 1-5 GM/200ML-% IV SOLN: 1000.0000 mg | INTRAVENOUS | Status: DC

## 2013-04-01 MED ORDER — FENTANYL CITRATE 0.05 MG/ML IJ SOLN: 25.0000 ug | INTRAMUSCULAR | Status: DC | PRN

## 2013-04-01 MED ORDER — ZOLPIDEM TARTRATE 5 MG PO TABS: 5.0000 mg | ORAL_TABLET | Freq: Every evening | ORAL | Status: DC | PRN

## 2013-04-01 MED ORDER — 0.9 % SODIUM CHLORIDE (POUR BTL) OPTIME
TOPICAL | Status: DC | PRN
  Administered 2013-04-01: 1000 mL

## 2013-04-01 MED ORDER — INSULIN ASPART 100 UNIT/ML ~~LOC~~ SOLN
0.0000 [IU] | SUBCUTANEOUS | Status: DC
  Administered 2013-04-02 (×3): 3 [IU] via SUBCUTANEOUS

## 2013-04-01 MED ORDER — DILTIAZEM HCL ER BEADS 240 MG PO CP24
360.0000 mg | ORAL_CAPSULE | Freq: Every day | ORAL | Status: DC
  Filled 2013-04-01: qty 1

## 2013-04-01 MED ORDER — FENTANYL CITRATE 0.05 MG/ML IJ SOLN
INTRAMUSCULAR | Status: DC | PRN
  Administered 2013-04-01 (×2): 50 ug via INTRAVENOUS

## 2013-04-01 MED ORDER — OXYCODONE HCL 5 MG PO TABS: 5.0000 mg | ORAL_TABLET | Freq: Once | ORAL | Status: DC | PRN

## 2013-04-01 MED ORDER — SIMVASTATIN 20 MG PO TABS
20.0000 mg | ORAL_TABLET | Freq: Every day | ORAL | Status: DC
  Filled 2013-04-01 (×2): qty 1

## 2013-04-01 MED ORDER — METHOCARBAMOL 100 MG/ML IJ SOLN
500.0000 mg | Freq: Four times a day (QID) | INTRAMUSCULAR | Status: DC | PRN
  Filled 2013-04-01: qty 5

## 2013-04-01 MED ORDER — POLYVINYL ALCOHOL 1.4 % OP SOLN
1.0000 [drp] | Freq: Three times a day (TID) | OPHTHALMIC | Status: DC | PRN
Start: 2013-04-01 — End: 2013-04-02
  Filled 2013-04-01: qty 15

## 2013-04-01 MED ORDER — MORPHINE SULFATE 2 MG/ML IJ SOLN
INTRAMUSCULAR | Status: AC
  Filled 2013-04-01: qty 1

## 2013-04-01 MED ORDER — FERROUS SULFATE 325 (65 FE) MG PO TABS
325.0000 mg | ORAL_TABLET | Freq: Every day | ORAL | Status: DC
  Administered 2013-04-02: 325 mg via ORAL
  Filled 2013-04-01 (×2): qty 1

## 2013-04-01 MED ORDER — OLOPATADINE HCL 0.1 % OP SOLN
1.0000 [drp] | Freq: Two times a day (BID) | OPHTHALMIC | Status: DC | PRN
  Filled 2013-04-01: qty 5

## 2013-04-01 MED ORDER — OXYCODONE HCL 5 MG/5ML PO SOLN: 5.0000 mg | Freq: Once | ORAL | Status: DC | PRN

## 2013-04-01 MED ORDER — SUCCINYLCHOLINE CHLORIDE 20 MG/ML IJ SOLN
INTRAMUSCULAR | Status: DC | PRN
  Administered 2013-04-01: 30 mg via INTRAVENOUS

## 2013-04-01 MED ORDER — BENAZEPRIL HCL 40 MG PO TABS
40.0000 mg | ORAL_TABLET | Freq: Every day | ORAL | Status: DC
  Administered 2013-04-01: 40 mg via ORAL
  Filled 2013-04-01 (×2): qty 1

## 2013-04-01 SURGICAL SUPPLY — 90 items
BANDAGE ELASTIC 3 VELCRO ST LF (GAUZE/BANDAGES/DRESSINGS) ×2 IMPLANT
BANDAGE ELASTIC 4 VELCRO ST LF (GAUZE/BANDAGES/DRESSINGS) ×2 IMPLANT
BANDAGE GAUZE ELAST BULKY 4 IN (GAUZE/BANDAGES/DRESSINGS) ×2 IMPLANT
BIT DRILL 2.5X110 QC LCP DISP (BIT) ×1 IMPLANT
BIT DRILL 2.8 (BIT) ×1
BIT DRILL CANN QC 2.8X165 (BIT) IMPLANT
BIT DRILL LCP QC 2X140 (BIT) ×1 IMPLANT
BLADE LONG MED 31X9 (MISCELLANEOUS) ×2 IMPLANT
BLADE SURG ROTATE 9660 (MISCELLANEOUS) IMPLANT
BNDG CMPR 9X4 STRL LF SNTH (GAUZE/BANDAGES/DRESSINGS) ×1
BNDG ESMARK 4X9 LF (GAUZE/BANDAGES/DRESSINGS) ×2 IMPLANT
CLOTH BEACON ORANGE TIMEOUT ST (SAFETY) ×2 IMPLANT
CORDS BIPOLAR (ELECTRODE) ×2 IMPLANT
COVER SURGICAL LIGHT HANDLE (MISCELLANEOUS) ×2 IMPLANT
CUFF TOURNIQUET SINGLE 18IN (TOURNIQUET CUFF) ×2 IMPLANT
CUFF TOURNIQUET SINGLE 24IN (TOURNIQUET CUFF) IMPLANT
DRAIN TLS ROUND 10FR (DRAIN) IMPLANT
DRAPE INCISE IOBAN 66X45 STRL (DRAPES) ×1 IMPLANT
DRAPE OEC MINIVIEW 54X84 (DRAPES) ×2 IMPLANT
DRAPE PED LAPAROTOMY (DRAPES) ×1 IMPLANT
DRAPE SURG 17X11 SM STRL (DRAPES) ×7 IMPLANT
DRILL BIT 2.8MM (BIT) ×2
DRSG ADAPTIC 3X8 NADH LF (GAUZE/BANDAGES/DRESSINGS) ×2 IMPLANT
ELECT REM PT RETURN 9FT ADLT (ELECTROSURGICAL) ×2
ELECTRODE REM PT RTRN 9FT ADLT (ELECTROSURGICAL) IMPLANT
GAUZE SPONGE 4X4 16PLY XRAY LF (GAUZE/BANDAGES/DRESSINGS) ×2 IMPLANT
GLOVE BIOGEL PI IND STRL 6 (GLOVE) IMPLANT
GLOVE BIOGEL PI IND STRL 6.5 (GLOVE) IMPLANT
GLOVE BIOGEL PI IND STRL 7.5 (GLOVE) IMPLANT
GLOVE BIOGEL PI IND STRL 8.5 (GLOVE) ×1 IMPLANT
GLOVE BIOGEL PI INDICATOR 6 (GLOVE) ×1
GLOVE BIOGEL PI INDICATOR 6.5 (GLOVE) ×1
GLOVE BIOGEL PI INDICATOR 7.5 (GLOVE) ×1
GLOVE BIOGEL PI INDICATOR 8.5 (GLOVE) ×1
GLOVE SURG SS PI 6.5 STRL IVOR (GLOVE) ×1 IMPLANT
GLOVE SURG SS PI 7.5 STRL IVOR (GLOVE) ×1 IMPLANT
GLOVE SURG SS PI 8.0 STRL IVOR (GLOVE) ×1 IMPLANT
GOWN PREVENTION PLUS XLARGE (GOWN DISPOSABLE) ×2 IMPLANT
GOWN STRL NON-REIN LRG LVL3 (GOWN DISPOSABLE) ×6 IMPLANT
KIT BASIN OR (CUSTOM PROCEDURE TRAY) ×2 IMPLANT
KIT ROOM TURNOVER OR (KITS) ×2 IMPLANT
MANIFOLD NEPTUNE II (INSTRUMENTS) ×2 IMPLANT
NDL HYPO 25X1 1.5 SAFETY (NEEDLE) ×1 IMPLANT
NEEDLE HYPO 25X1 1.5 SAFETY (NEEDLE) ×2 IMPLANT
NS IRRIG 1000ML POUR BTL (IV SOLUTION) ×2 IMPLANT
PACK ORTHO EXTREMITY (CUSTOM PROCEDURE TRAY) ×2 IMPLANT
PAD ARMBOARD 7.5X6 YLW CONV (MISCELLANEOUS) ×4 IMPLANT
PAD CAST 4YDX4 CTTN HI CHSV (CAST SUPPLIES) ×1 IMPLANT
PADDING CAST COTTON 4X4 STRL (CAST SUPPLIES) ×2
PLATE WRIST FUSION STERILE (Plate) ×1 IMPLANT
PUTTY DBX 1CC (Putty) ×2 IMPLANT
PUTTY DBX 1CC DEPUY (Putty) IMPLANT
SCREW CORTEX 2.7 SLF-TPNG 16MM (Screw) ×1 IMPLANT
SCREW CORTEX 3.5 14MM (Screw) ×1 IMPLANT
SCREW CORTEX 3.5 16MM (Screw) ×1 IMPLANT
SCREW CORTEX 3.5 18MM (Screw) ×1 IMPLANT
SCREW CORTEX 3.5 20MM (Screw) ×1 IMPLANT
SCREW LOCK CORT ST 3.5X14 (Screw) IMPLANT
SCREW LOCK CORT ST 3.5X16 (Screw) IMPLANT
SCREW LOCK CORT ST 3.5X18 (Screw) IMPLANT
SCREW LOCK CORT ST 3.5X20 (Screw) IMPLANT
SCREW LOCK ST 2.7X10 (Screw) ×1 IMPLANT
SCREW LOCK ST 2.7X18 (Screw) ×4 IMPLANT
SCREW LOCK T15 FT 16X3.5X2.9X (Screw) IMPLANT
SCREW LOCK T15 FT 18X3.5X2.9X (Screw) IMPLANT
SCREW LOCK T8 18X2.7X2.1XST (Screw) IMPLANT
SCREW LOCKING 3.5X16 (Screw) ×2 IMPLANT
SCREW LOCKING 3.5X18 (Screw) ×2 IMPLANT
SCREW SELF TAP 12M (Screw) ×1 IMPLANT
SOAP 2 % CHG 4 OZ (WOUND CARE) ×2 IMPLANT
SPLINT FIBERGLASS 3X35 (CAST SUPPLIES) ×1 IMPLANT
SPONGE GAUZE 4X4 12PLY (GAUZE/BANDAGES/DRESSINGS) ×3 IMPLANT
STAPLER VISISTAT 35W (STAPLE) ×1 IMPLANT
STRIP CLOSURE SKIN 1/2X4 (GAUZE/BANDAGES/DRESSINGS) IMPLANT
SUCTION FRAZIER TIP 10 FR DISP (SUCTIONS) ×1 IMPLANT
SUCTION FRAZIER TIP 8 FR DISP (SUCTIONS) ×1
SUCTION TUBE FRAZIER 8FR DISP (SUCTIONS) IMPLANT
SUT ETHILON 4 0 PS 2 18 (SUTURE) ×2 IMPLANT
SUT MNCRL AB 4-0 PS2 18 (SUTURE) IMPLANT
SUT VIC AB 2-0 FS1 27 (SUTURE) ×2 IMPLANT
SUT VICRYL 4-0 PS2 18IN ABS (SUTURE) ×2 IMPLANT
SYR CONTROL 10ML LL (SYRINGE) IMPLANT
SYSTEM CHEST DRAIN TLS 7FR (DRAIN) IMPLANT
TAPE SURG TRANSPORE 1 IN (GAUZE/BANDAGES/DRESSINGS) IMPLANT
TAPE SURGICAL TRANSPORE 1 IN (GAUZE/BANDAGES/DRESSINGS) ×1
TOWEL OR 17X24 6PK STRL BLUE (TOWEL DISPOSABLE) ×2 IMPLANT
TOWEL OR 17X26 10 PK STRL BLUE (TOWEL DISPOSABLE) ×2 IMPLANT
TUBE CONNECTING 12X1/4 (SUCTIONS) ×2 IMPLANT
WATER STERILE IRR 1000ML POUR (IV SOLUTION) ×2 IMPLANT
YANKAUER SUCT BULB TIP NO VENT (SUCTIONS) ×1 IMPLANT

## 2013-04-01 NOTE — Transfer of Care (Signed)
Immediate Anesthesia Transfer of Care Note  Patient: Shari Golden  Procedure(s) Performed: Procedure(s): RIGHT WRIST ARTHRODESIS WITH POSSIBLE LOCAL BONE GRAFT (Right)  Patient Location: PACU  Anesthesia Type:General  Level of Consciousness: awake, alert  and oriented  Airway & Oxygen Therapy: Patient Spontanous Breathing and Patient connected to nasal cannula oxygen  Post-op Assessment: Report given to PACU RN and Post -op Vital signs reviewed and stable  Post vital signs: Reviewed and stable  Complications: No apparent anesthesia complications

## 2013-04-01 NOTE — Brief Op Note (Signed)
04/01/2013  12:41 PM  PATIENT:  Shari Golden  79 y.o. female  PRE-OPERATIVE DIAGNOSIS:  Right Wrist Advanced Arthritis  POST-OPERATIVE DIAGNOSIS:  SAME  PROCEDURE:  Procedure(s): RIGHT WRIST ARTHRODESIS WITH POSSIBLE LOCAL BONE GRAFT (Right)  SURGEON:  Surgeon(s) and Role:    * Sharma Covert, MD - Primary  PHYSICIAN ASSISTANT:   ASSISTANTS: none   ANESTHESIA:   general  EBL:     BLOOD ADMINISTERED:none  DRAINS: none   LOCAL MEDICATIONS USED:  MARCAINE     SPECIMEN:  No Specimen  DISPOSITION OF SPECIMEN:  N/A  COUNTS:  YES  TOURNIQUET:    DICTATION: .[id number]  PLAN OF CARE: Admit for overnight observation  PATIENT DISPOSITION:  PACU - hemodynamically stable.   Delay start of Pharmacological VTE agent (>24hrs) due to surgical blood loss or risk of bleeding: not applicable

## 2013-04-01 NOTE — Anesthesia Preprocedure Evaluation (Addendum)
Anesthesia Evaluation  Patient identified by MRN, date of birth, ID band Patient awake    Airway Mallampati: II TM Distance: >3 FB Neck ROM: Full    Dental  (+) Dental Advisory Given   Pulmonary neg pulmonary ROS,  breath sounds clear to auscultation        Cardiovascular hypertension, Pt. on medications + Peripheral Vascular Disease + dysrhythmias Atrial Fibrillation Rhythm:Irregular Rate:Normal     Neuro/Psych PSYCHIATRIC DISORDERS Anxiety  Neuromuscular disease    GI/Hepatic GERD-  ,  Endo/Other  diabetes  Renal/GU CRFRenal disease     Musculoskeletal   Abdominal   Peds  Hematology  (+) Blood dyscrasia, anemia ,   Anesthesia Other Findings   Reproductive/Obstetrics                         Anesthesia Physical Anesthesia Plan  ASA: III  Anesthesia Plan: General   Post-op Pain Management:    Induction: Intravenous  Airway Management Planned: LMA  Additional Equipment:   Intra-op Plan:   Post-operative Plan: Extubation in OR  Informed Consent: I have reviewed the patients History and Physical, chart, labs and discussed the procedure including the risks, benefits and alternatives for the proposed anesthesia with the patient or authorized representative who has indicated his/her understanding and acceptance.     Plan Discussed with:   Anesthesia Plan Comments:         Anesthesia Quick Evaluation

## 2013-04-01 NOTE — H&P (Signed)
Shari Golden is an 79 y.o. female.   Chief Complaint: right hand and wrist pain HPI: pt with longstanding right wrist problems Pt with advanced kienbocks disease and underwent several years ago PRC Pt with advancing arthritis and here for surgery to relieve pain in right wrist Pt understands role of surgery and reason to fuse her right wrist Pain relief  Past Medical History  Diagnosis Date  . Diabetes mellitus   . Hypertension   . Anemia   . Constipation 07/18/2009  . GERD (gastroesophageal reflux disease)   . Peripheral neuropathy   . Carotid artery disease     Dr. Hart Rochester, consult done by him August, 2010, his protocol to follow the patient  . Chest pain     2004, catheterization, normal coronaries /  nuclear May, 2010, no ischemia atrial fibrillation  . Atrial fibrillation     ??? In the past, not a recurring problem.  . Ejection fraction     EF 60-65%, echo, June, 2010  . Mitral regurgitation     Mild, echo, 2010  . Atrial septal aneurysm     Echo, 2010   . Edema     Chronic  . Anxiety   . Chronic kidney disease     Nephrologist Dr. Fausto Skillern    Past Surgical History  Procedure Laterality Date  . Abdominal hysterectomy    . Appendectomy    . Carpal tunnel release      bilateral  . Shoulder surgery      left rotator cuff  . Elbow surgery      bilateral  . Back surgery    . Tonsillectomy  2012    Laminectomy    Family History  Problem Relation Age of Onset  . Diabetes Sister   . Stroke Neg Hx   . Coronary artery disease Neg Hx    Social History:  reports that she quit smoking about 58 years ago. Her smoking use included Cigarettes. She smoked 0.00 packs per day for 3 years. She has never used smokeless tobacco. She reports that she does not drink alcohol or use illicit drugs.  Allergies:  Allergies  Allergen Reactions  . Erythromycin     REACTION: Dehydration  . Iodine     REACTION: rash, dizzy  . Iohexol      Code: HIVES, Desc: IODINE BASED  CONTRAST   . Latex     REACTION: Swelling (If touched on face will have facial swelling)  . Penicillins     REACTION: Rash    Medications Prior to Admission  Medication Sig Dispense Refill  . aspirin 81 MG chewable tablet Chew 81 mg by mouth daily.      . benazepril (LOTENSIN) 40 MG tablet Take 40 mg by mouth daily.       . carboxymethylcellulose (REFRESH PLUS) 0.5 % SOLN Place 1 drop into both eyes 3 (three) times daily as needed (dry eyes).      Marland Kitchen diltiazem (TIAZAC) 360 MG 24 hr capsule Take 360 mg by mouth daily.      . ferrous sulfate 325 (65 FE) MG tablet Take 325 mg by mouth daily with breakfast.      . furosemide (LASIX) 40 MG tablet Take 20-40 mg by mouth daily. Depending on swelling      . insulin glargine (LANTUS) 100 UNIT/ML injection Inject 10-15 Units into the skin 4 (four) times daily as needed (sugar level). Over 185 takes 10 units, CBG of 250 takes 15 units      .  LORazepam (ATIVAN) 0.5 MG tablet Take 0.5 mg by mouth daily.       . multivitamin (RENA-VIT) TABS tablet Take 1 tablet by mouth daily.        . pantoprazole (PROTONIX) 40 MG tablet Take 40 mg by mouth daily.        Marland Kitchen PATADAY 0.2 % SOLN Place 1 drop into both eyes 2 (two) times daily as needed (allergy eyes).       . simvastatin (ZOCOR) 20 MG tablet Take 20 mg by mouth at bedtime.          Results for orders placed during the hospital encounter of 04/01/13 (from the past 48 hour(s))  SAMPLE TO BLOOD BANK     Status: None   Collection Time    04/01/13 10:56 AM      Result Value Range   Blood Bank Specimen SAMPLE AVAILABLE FOR TESTING     Sample Expiration 04/02/2013    GLUCOSE, CAPILLARY     Status: Abnormal   Collection Time    04/01/13 10:59 AM      Result Value Range   Glucose-Capillary 109 (*) 70 - 99 mg/dL  CBC     Status: Abnormal   Collection Time    04/01/13 11:00 AM      Result Value Range   WBC 5.8  4.0 - 10.5 K/uL   RBC 3.43 (*) 3.87 - 5.11 MIL/uL   Hemoglobin 10.4 (*) 12.0 - 15.0 g/dL    HCT 16.1 (*) 09.6 - 46.0 %   MCV 89.5  78.0 - 100.0 fL   MCH 30.3  26.0 - 34.0 pg   MCHC 33.9  30.0 - 36.0 g/dL   RDW 04.5  40.9 - 81.1 %   Platelets 177  150 - 400 K/uL   No results found.  NO recent illnesses or hospitalizations  Blood pressure 126/76, pulse 73, temperature 98 F (36.7 C), temperature source Oral, resp. rate 18, SpO2 99.00%. General Appearance:  Alert, cooperative, no distress, appears stated age  Head:  Normocephalic, without obvious abnormality, atraumatic  Eyes:  Pupils equal, conjunctiva/corneas clear,         Throat: Lips, mucosa, and tongue normal; teeth and gums normal  Neck: No visible masses     Lungs:   respirations unlabored  Chest Wall:  No tenderness or deformity  Heart:  Regular rate and rhythm,  Abdomen:   Soft, non-tender,         Extremities: Right wrist and hand: intrinsic atrophy present Fingers warm well perfused Good cap refil Pain with wrist flexiion and extension  Pulses: 2+ and symmetric  Skin: Skin color, texture, turgor normal, no rashes or lesions     Neurologic: Normal     Assessment/Plan Right wrist advanced arthritis after proximal row carpectomy  Right wrist arthrodesis with local versus iliac crest bone grafting  R/B/A DISCUSSED WITH PT IN OFFICE.  PT VOICED UNDERSTANDING OF PLAN CONSENT SIGNED DAY OF SURGERY PT SEEN AND EXAMINED PRIOR TO OPERATIVE PROCEDURE/DAY OF SURGERY SITE MARKED. QUESTIONS ANSWERED WILL REMAIN OVERNIGHT OBSERVATION FOLLOWING SURGERY  Sharma Covert 04/01/2013, 12:38 PM

## 2013-04-01 NOTE — Preoperative (Signed)
Beta Blockers   Reason not to administer Beta Blockers:Not Applicable 

## 2013-04-01 NOTE — Anesthesia Procedure Notes (Signed)
Anesthesia Regional Block:  Supraclavicular block  Pre-Anesthetic Checklist: ,, timeout performed, Correct Patient, Correct Site, Correct Laterality, Correct Procedure, Correct Position, site marked, Risks and benefits discussed, Surgical consent,  Pre-op evaluation,  Post-op pain management  Laterality: Right and Upper  Prep: chloraprep       Needles:   Needle Type: Echogenic Needle     Needle Length:cm 9 cm     Additional Needles:  Procedures: Doppler guided and ultrasound guided (picture in chart) Supraclavicular block Narrative:  Start time: 04/01/2013 12:30 PM End time: 04/01/2013 12:44 PM Injection made incrementally with aspirations every 5 mL.  Performed by: Personally  Anesthesiologist: T Deshanda Molitor  Additional Notes: Tolerated well

## 2013-04-02 ENCOUNTER — Encounter (HOSPITAL_COMMUNITY): Payer: Self-pay | Admitting: Orthopedic Surgery

## 2013-04-02 LAB — GLUCOSE, CAPILLARY
Glucose-Capillary: 193 mg/dL — ABNORMAL HIGH (ref 70–99)
Glucose-Capillary: 158 mg/dL — ABNORMAL HIGH (ref 70–99)
Glucose-Capillary: 176 mg/dL — ABNORMAL HIGH (ref 70–99)

## 2013-04-02 MED ORDER — ATORVASTATIN CALCIUM 10 MG PO TABS
10.0000 mg | ORAL_TABLET | Freq: Every day | ORAL | Status: DC
  Filled 2013-04-02: qty 1

## 2013-04-02 MED ORDER — OXYCODONE-ACETAMINOPHEN 5-325 MG PO TABS: 1.0000 | ORAL_TABLET | ORAL | Status: DC | PRN

## 2013-04-02 MED ORDER — VITAMIN C 500 MG PO TABS: 500.0000 mg | ORAL_TABLET | Freq: Every day | ORAL | Status: DC

## 2013-04-02 MED ORDER — METHOCARBAMOL 500 MG PO TABS: 500.0000 mg | ORAL_TABLET | Freq: Four times a day (QID) | ORAL | Status: DC

## 2013-04-02 MED ORDER — DOCUSATE SODIUM 100 MG PO CAPS: 100.0000 mg | ORAL_CAPSULE | Freq: Two times a day (BID) | ORAL | Status: DC

## 2013-04-02 NOTE — Discharge Planning (Signed)
Physician Discharge Summary  Patient ID: Shari Golden MRN: [id number] DOB/AGE: 1930/03/20 79 y.o.  Admit date: 04/01/2013 Discharge date: 04/02/2013  Admission Diagnoses: Right Wrist Advanced Arthritis Past Medical History  Diagnosis Date  . Hypertension   . Anemia   . Constipation 07/18/2009  . GERD (gastroesophageal reflux disease)   . Peripheral neuropathy   . Carotid artery disease     Dr. Hart Rochester, consult done by him August, 2010, his protocol to follow the patient  . Chest pain     2004, catheterization, normal coronaries /  nuclear May, 2010, no ischemia atrial fibrillation  . Atrial fibrillation     ??? In the past, not a recurring problem.  . Ejection fraction     EF 60-65%, echo, June, 2010  . Mitral regurgitation     Mild, echo, 2010  . Atrial septal aneurysm     Echo, 2010   . Edema     Chronic  . Anxiety   . Chronic kidney disease     Nephrologist Dr. Fausto Skillern  . Type II diabetes mellitus   . Heart murmur   . Arthritis     "right wrist" (04/01/2013)    Discharge Diagnoses:  Active Problems:   * No active hospital problems. *   Surgeries: Procedure(s): RIGHT WRIST ARTHRODESIS WITH POSSIBLE LOCAL BONE GRAFT on 04/01/2013    Consultants:  none  Discharged Condition: Improved  Hospital Course: Shari Golden is an 79 y.o. female who was admitted 04/01/2013 with a chief complaint of No chief complaint on file. , and found to have a diagnosis of Right Wrist Advanced Arthritis.  They were brought to the operating room on 04/01/2013 and underwent Procedure(s): RIGHT WRIST ARTHRODESIS WITH POSSIBLE LOCAL BONE GRAFT.    They were given perioperative antibiotics: Anti-infectives   Start     Dose/Rate Route Frequency Ordered Stop   04/02/13 1200  vancomycin (VANCOCIN) 500 mg in sodium chloride 0.9 % 100 mL IVPB     500 mg 100 mL/hr over 60 Minutes Intravenous  Once 04/01/13 2026     04/02/13 1000  vancomycin (VANCOCIN) 1,250 mg in sodium chloride 0.9 % 250 mL  IVPB  Status:  Discontinued     1,250 mg 166.7 mL/hr over 90 Minutes Intravenous  Once 04/01/13 2020 04/01/13 2024   04/01/13 1100  vancomycin (VANCOCIN) IVPB 1000 mg/200 mL premix  Status:  Discontinued     1,000 mg 200 mL/hr over 60 Minutes Intravenous On call to O.R. 04/01/13 1046 04/01/13 1826   04/01/13 1050  vancomycin (VANCOCIN) 1 GM/200ML IVPB    Comments:  TODD, ROBERT: cabinet override      04/01/13 1050 04/01/13 1244    .  They were given sequential compression devices, early ambulation, and ambulation for DVT prophylaxis.  Recent vital signs: Patient Vitals for the past 24 hrs:  BP Temp Temp src Pulse Resp SpO2 Height Weight  04/02/13 0529 140/50 mmHg 98.2 F (36.8 C) - 106 16 100 % - -  04/02/13 0212 163/138 mmHg 97.5 F (36.4 C) Oral 110 17 100 % - -  04/01/13 2250 151/62 mmHg - - - - - - -  04/01/13 2111 164/83 mmHg 97.4 F (36.3 C) Oral 102 15 98 % - -  04/01/13 1900 - - - - - - 5' 6.93" (1.7 m) 72.2 kg (159 lb 2.8 oz)  04/01/13 1844 112/78 mmHg 97.9 F (36.6 C) Oral 88 14 100 % - -  04/01/13 1800 170/104 mmHg 98 F (  36.7 C) - 85 15 99 % - -  04/01/13 1745 168/86 mmHg - - 88 15 98 % - -  04/01/13 1730 173/69 mmHg - - 87 14 98 % - -  04/01/13 1715 169/66 mmHg - - 78 11 98 % - -  04/01/13 1700 165/78 mmHg - - 71 10 98 % - -  04/01/13 1645 162/85 mmHg - - 73 10 99 % - -  04/01/13 1630 157/57 mmHg - - 73 11 99 % - -  04/01/13 1620 144/55 mmHg 98.1 F (36.7 C) - 75 11 99 % - -  04/01/13 1053 126/76 mmHg 98 F (36.7 C) Oral 73 18 99 % - -  .  Recent laboratory studies: No results found.  Discharge Medications:     Medication List    TAKE these medications       aspirin 81 MG chewable tablet  Chew 81 mg by mouth daily.     benazepril 40 MG tablet  Commonly known as:  LOTENSIN  Take 40 mg by mouth daily.     carboxymethylcellulose 0.5 % Soln  Commonly known as:  REFRESH PLUS  Place 1 drop into both eyes 3 (three) times daily as needed (dry eyes).      diltiazem 360 MG 24 hr capsule  Commonly known as:  TIAZAC  Take 360 mg by mouth daily.     docusate sodium 100 MG capsule  Commonly known as:  COLACE  Take 1 capsule (100 mg total) by mouth 2 (two) times daily.     ferrous sulfate 325 (65 FE) MG tablet  Take 325 mg by mouth daily with breakfast.     furosemide 40 MG tablet  Commonly known as:  LASIX  Take 20-40 mg by mouth daily. Depending on swelling     insulin glargine 100 UNIT/ML injection  Commonly known as:  LANTUS  Inject 10-15 Units into the skin 4 (four) times daily as needed (sugar level). Over 185 takes 10 units, CBG of 250 takes 15 units     LORazepam 0.5 MG tablet  Commonly known as:  ATIVAN  Take 0.5 mg by mouth daily.     methocarbamol 500 MG tablet  Commonly known as:  ROBAXIN  Take 1 tablet (500 mg total) by mouth 4 (four) times daily.     multivitamin Tabs tablet  Take 1 tablet by mouth daily.     oxyCODONE-acetaminophen 5-325 MG per tablet  Commonly known as:  ROXICET  Take 1 tablet by mouth every 4 (four) hours as needed for pain.     pantoprazole 40 MG tablet  Commonly known as:  PROTONIX  Take 40 mg by mouth daily.     PATADAY 0.2 % Soln  Generic drug:  Olopatadine HCl  Place 1 drop into both eyes 2 (two) times daily as needed (allergy eyes).     simvastatin 20 MG tablet  Commonly known as:  ZOCOR  Take 20 mg by mouth at bedtime.     vitamin C 500 MG tablet  Commonly known as:  ASCORBIC ACID  Take 1 tablet (500 mg total) by mouth daily.        Diagnostic Studies: Dg Chest 2 View  03/26/2013  *RADIOLOGY REPORT*  Clinical Data: Preop for wrist surgery.  Gastroesophageal reflux disease.  Atrial fibrillation.  Diabetes.  CHEST - 2 VIEW  Comparison: 01/17/2010  Findings: Possible remote 6th anterolateral left rib trauma. Midline trachea.  Normal heart size and mediastinal contours for age.  No pleural effusion or pneumothorax.  Suspect scattered calcified left upper lobe calcified granulomas,  unchanged.  Lungs otherwise clear.  IMPRESSION: No acute cardiopulmonary disease.   Original Report Authenticated By: Jeronimo Greaves, M.D.     They benefited maximally from their hospital stay and there were no complications.     Disposition:       Future Appointments Provider Department Dept Phone   11/10/2013 12:00 PM [facility] [phone]   11/10/2013 1:20 PM Evern Bio, NP [facility] [phone]     Follow-up Information   Follow up with Sharma Covert, MD. Schedule an appointment as soon as possible for a visit in 2 weeks.   Contact information:   [address] [place] [address] [phone]        Signed: Sharma Covert 04/02/2013, 7:31 AM

## 2013-04-02 NOTE — Progress Notes (Signed)
Patient discharged in stable condition via wheelchair to home. Discharge instructions were given and explained

## 2013-04-02 NOTE — Anesthesia Postprocedure Evaluation (Signed)
Anesthesia Post Note  Patient: Shari Golden  Procedure(s) Performed: Procedure(s) (LRB): RIGHT WRIST ARTHRODESIS WITH POSSIBLE LOCAL BONE GRAFT (Right)  Anesthesia type: General  Patient location: PACU  Post pain: Pain level controlled  Post assessment: Patient's Cardiovascular Status Stable  Last Vitals:  Filed Vitals:   04/02/13 0529  BP: 140/50  Pulse: 106  Temp: 36.8 C  Resp: 16    Post vital signs: Reviewed and stable  Level of consciousness: alert  Complications: No apparent anesthesia complications

## 2013-04-02 NOTE — Evaluation (Signed)
Occupational Therapy Evaluation Patient Details Name: Shari Golden MRN: [id number] DOB: September 26, 1930 Today's Date: 04/02/2013 Time: 9528-4132 OT Time Calculation (min): 41 min  OT Assessment / Plan / Recommendation Clinical Impression   79 y.o. Pt s/p Right wrist arthrodesis with local autogenous bone graft. Pt presents at Minguard level for ADLs but requires assistance for balance. Concerned for safety upon returning home due to aide only being present 6 hours per day. Recommend HHOT upon d/c.  OT to follow.      OT Assessment  Patient needs continued OT Services    Follow Up Recommendations  Home health OT;Supervision/Assistance - 24 hour    Barriers to Discharge Decreased caregiver support (aide only with her 6 hours per day) granddaughter is staying with her for the next few days.     Equipment Recommendations  Other (comment) (tbd)    Recommendations for Other Services    Frequency  Min 2X/week    Precautions / Restrictions Precautions Precautions: Fall Restrictions Weight Bearing Restrictions: Yes RUE Weight Bearing: Non weight bearing (through wrist)   Pertinent Vitals/Pain No pain reported. BP 126/46 after sit to stand. 119/38 (few minutes later) laying back down after standing . Nurse notified.    ADL  Eating/Feeding: Independent Where Assessed - Eating/Feeding: Bed level Grooming: Min guard Where Assessed - Grooming: Unsupported standing Upper Body Bathing: Set up Where Assessed - Upper Body Bathing: Unsupported sitting Lower Body Bathing: Min guard Where Assessed - Lower Body Bathing: Unsupported sit to stand Upper Body Dressing: Set up Where Assessed - Upper Body Dressing: Unsupported sitting Lower Body Dressing: Min guard Where Assessed - Lower Body Dressing: Unsupported sit to stand Toilet Transfer: Min Pension scheme manager Method: Sit to Barista: Comfort height toilet Toileting - Architect and Hygiene: Min  guard Where Assessed - Engineer, mining and Hygiene: Standing Tub/Shower Transfer Method: Not assessed Equipment Used: Gait belt Transfers/Ambulation Related to ADLs: Min A (handheld) for ambulation due to decreased balance ADL Comments: Pt able to don/doff socks while sitting on eob unsupported. Pt donned underwear with unsupported sit to stand from elevated bed with Minguard assist. Pt with balance deficits. Need to assess tub transfer and balance for ADLs before d/c.  Elevated RUE and iced. Also educated on moving digits to decrease edema.    OT Diagnosis: Acute pain  OT Problem List: Decreased activity tolerance;Impaired balance (sitting and/or standing);Decreased knowledge of precautions;Pain;Impaired UE functional use;Decreased knowledge of use of DME or AE OT Treatment Interventions: Self-care/ADL training;Therapeutic exercise;DME and/or AE instruction;Therapeutic activities;Patient/family education;Balance training   OT Goals Acute Rehab OT Goals OT Goal Formulation: With patient Time For Goal Achievement: 04/09/13 Potential to Achieve Goals: Good ADL Goals Pt Will Perform Lower Body Dressing: Sit to stand from bed;Sit to stand from chair;with supervision ADL Goal: Lower Body Dressing - Progress: Goal set today Pt Will Transfer to Toilet: Independently;Ambulation;Comfort height toilet;Grab bars ADL Goal: Toilet Transfer - Progress: Goal set today Pt Will Perform Toileting - Clothing Manipulation: Independently;Standing ADL Goal: Toileting - Clothing Manipulation - Progress: Goal set today Pt Will Perform Toileting - Hygiene: Independently;Sit to stand from 3-in-1/toilet ADL Goal: Toileting - Hygiene - Progress: Goal set today Pt Will Perform Tub/Shower Transfer: Tub transfer;with supervision;Ambulation;with DME ADL Goal: Tub/Shower Transfer - Progress: Goal set today Miscellaneous OT Goals Miscellaneous OT Goal #1: Pt will simulate simple meal prep independently for  4 minute duration to address balance deficits. OT Goal: Miscellaneous Goal #1 - Progress: Goal set today Miscellaneous OT  Goal #2: Pt will perform grooming tasks independently while standing at sink for 5 minutes while maintaining balance. OT Goal: Miscellaneous Goal #2 - Progress: Goal set today  Visit Information  Last OT Received On: 04/02/13 Assistance Needed: +1    Subjective Data  Subjective: I think I need a walker.   Prior Functioning     Home Living Lives With: Alone Available Help at Discharge: Other (Comment) (aid stays with her 6 hours a day) Type of Home: Apartment Bathroom Shower/Tub: Tub/shower unit Home Adaptive Equipment: Shower chair with back;Grab bars around toilet;Grab bars in shower;Other (comment);Straight cane (toilet riser) Additional Comments: uses cane sometimes Prior Function Level of Independence: Needs assistance Needs Assistance: Light Housekeeping;Meal Prep Dominant Hand: Right         Vision/Perception     Cognition  Cognition Arousal/Alertness: Awake/alert Behavior During Therapy: WFL for tasks assessed/performed Overall Cognitive Status: Within Functional Limits for tasks assessed    Extremity/Trunk Assessment Right Upper Extremity Assessment RUE ROM/Strength/Tone: Deficits;Due to precautions Left Upper Extremity Assessment LUE ROM/Strength/Tone: WFL for tasks assessed     Mobility Bed Mobility Bed Mobility: Supine to Sit;Sitting - Scoot to Edge of Bed Supine to Sit: 5: Supervision;HOB flat Sitting - Scoot to Edge of Bed: 5: Supervision Details for Bed Mobility Assistance: supervision for safety. No cues needed Transfers Transfers: Sit to Stand;Stand to Sit Sit to Stand: 4: Min guard;With upper extremity assist;From bed Stand to Sit: 4: Min guard;With upper extremity assist;To bed     Exercise     Balance Balance Balance Assessed: Yes Dynamic Standing Balance Dynamic Standing - Level of Assistance: 4: Min assist (hand  held assist to walk due to decreased balance)   End of Session OT - End of Session Equipment Utilized During Treatment: Gait belt Activity Tolerance: Other (comment) (dizziness) Patient left: in bed;with call bell/phone within reach;with family/visitor present Nurse Communication: Other (comment);Mobility status (BP )  GO Functional Assessment Tool Used: clinical judgment Functional Limitation: Self care Self Care Current Status (Z6109): At least 1 percent but less than 20 percent impaired, limited or restricted Self Care Goal Status (U0454): At least 1 percent but less than 20 percent impaired, limited or restricted   Earlie Raveling OTR/L [phone] 04/02/2013, 11:34 AM

## 2013-04-02 NOTE — Progress Notes (Signed)
UR COMPLETED  

## 2013-04-02 NOTE — Evaluation (Signed)
Physical Therapy Evaluation Patient Details Name: Shari Golden MRN: [id number] DOB: 08-24-1930 Today's Date: 04/02/2013 Time: 9147-8295 PT Time Calculation (min): 23 min  PT Assessment / Plan / Recommendation Clinical Impression  Pt. presents s/p right wrist arthrodesis and stabilizing splint in her dominant hand.  She typically uses a straigth cane but is not stable even with a quad cane.  Upon use of hemiwalker, her stability improved nicely.  Pt. moves slowly and cautiously, and her granddaughter will be with her , by pt. report, at least initially.  Believe she will benefit from 24 hour initial assist and HHPT. Pt. is agreeable.  Pt. Has Dc oarders and Dc is anticipated this afternoon.  Will sign off.     PT Assessment  All further PT needs can be met in the next venue of care    Follow Up Recommendations  Home health PT;Supervision/Assistance - 24 hour;Supervision for mobility/OOB    Does the patient have the potential to tolerate intense rehabilitation      Barriers to Discharge        Equipment Recommendations  Other (comment) (hemiwalker; informed case manager Vance Peper)    Recommendations for Other Services     Frequency      Precautions / Restrictions Precautions Precautions: Fall Restrictions Weight Bearing Restrictions: Yes RUE Weight Bearing: Non weight bearing   Pertinent Vitals/Pain Denies pain      Mobility  Bed Mobility Bed Mobility: Supine to Sit;Sitting - Scoot to Edge of Bed Supine to Sit: 5: Supervision;HOB flat Sitting - Scoot to Edge of Bed: 5: Supervision Details for Bed Mobility Assistance: supervision for safety. No cues needed Transfers Transfers: Sit to Stand;Stand to Sit Sit to Stand: 4: Min guard;With upper extremity assist;From bed Stand to Sit: 4: Min guard;With upper extremity assist;To chair/3-in-1 Details for Transfer Assistance: cues for safe hand placement Ambulation/Gait Ambulation/Gait Assistance: 4: Min guard Ambulation  Distance (Feet): 70 Feet Assistive device: Hemi-walker;Small based quad cane Ambulation/Gait Assistance Details: Pt. initially tried with quad cane but it did not offer enough support and she was unstable, not moving freely.  When swithched out with hemiwalker, pt. with improved stability and moving more freely.at min guard assist level Gait Pattern: Step-to pattern;Decreased step length - right;Decreased step length - left Gait velocity: decreased Stairs: No    Exercises     PT Diagnosis: Difficulty walking  PT Problem List: Decreased activity tolerance;Decreased balance;Decreased mobility;Decreased knowledge of use of DME PT Treatment Interventions:     PT Goals    Visit Information  Last PT Received On: 04/02/13 Assistance Needed: +1    Subjective Data  Subjective: My granddaughter is spending the night with me for several nights. Patient Stated Goal: resume independence   Prior Functioning  Home Living Lives With: Alone Available Help at Discharge: Neighbor;Family;Available 24 hours/day;Other (Comment) (24 hours initially, aide 6 hrs/day routinely) Type of Home: Apartment Home Access: Level entry Home Layout: One level Bathroom Shower/Tub: Engineer, manufacturing systems: Standard Home Adaptive Equipment: Shower chair with back;Grab bars around toilet;Grab bars in shower;Other (comment);Straight cane Additional Comments: uses cane sometimes Prior Function Level of Independence: Needs assistance Needs Assistance: Light Housekeeping;Meal Prep Driving: No Vocation: Retired Musician: No difficulties Dominant Hand: Right    Cognition  Cognition Arousal/Alertness: Awake/alert Behavior During Therapy: WFL for tasks assessed/performed Overall Cognitive Status: Within Functional Limits for tasks assessed    Extremity/Trunk Assessment Right Upper Extremity Assessment RUE ROM/Strength/Tone: Deficits;Due to precautions Left Upper Extremity  Assessment LUE ROM/Strength/Tone: [facility] for  tasks assessed Right Lower Extremity Assessment RLE ROM/Strength/Tone: [facility] for tasks assessed Left Lower Extremity Assessment LLE ROM/Strength/Tone: [facility] for tasks assessed Trunk Assessment Trunk Assessment: Normal   Balance Balance Balance Assessed: Yes Static Sitting Balance Static Sitting - Balance Support: No upper extremity supported;Feet supported Static Sitting - Level of Assistance: 7: Independent Static Standing Balance Static Standing - Balance Support: No upper extremity supported Static Standing - Level of Assistance: 5: Stand by assistance  End of Session PT - End of Session Equipment Utilized During Treatment: Gait belt Activity Tolerance: Patient tolerated treatment well Patient left: in chair;with call bell/phone within reach Nurse Communication: Mobility status;Other (comment) (need for hemiwalker and HHPT)  GP Functional Assessment Tool Used: clinical judgement Functional Limitation: Mobility: Walking and moving around Mobility: Walking and Moving Around Current Status [phone]): At least 1 percent but less than 20 percent impaired, limited or restricted Mobility: Walking and Moving Around Goal Status [phone]): At least 1 percent but less than 20 percent impaired, limited or restricted Mobility: Walking and Moving Around Discharge Status [phone]): At least 1 percent but less than 20 percent impaired, limited or restricted   Ferman Hamming 04/02/2013, 3:36 PM Weldon Picking PT Acute Rehab Services [phone] Beeper [phone]

## 2013-04-02 NOTE — Op Note (Signed)
NAME:  Shari Golden, Shari Golden NO.:  [id number]  MEDICAL RECORD NO.:  [id number]  LOCATION:  5N11C                        FACILITY:  [facility]  PHYSICIAN:  Madelynn Done, MD  DATE OF BIRTH:  [birth year]  DATE OF PROCEDURE:  04/01/2013 DATE OF DISCHARGE:                              OPERATIVE REPORT   PREOPERATIVE DIAGNOSIS:  Right wrist advanced arthritis after proximal row carpectomy.  POSTOPERATIVE DIAGNOSIS:  Right wrist advanced arthritis after proximal row carpectomy.  ATTENDING PHYSICIAN:  Sharma Covert IV, MD, who scrubbed and present for the entire procedure.  ASSISTANT SURGEON:  None.  ANESTHESIA:  General via LMA along with supraclavicular block performed by Dr. Jacklynn Bue.  SURGICAL PROCEDURE: 1. Right wrist arthrodesis with local autogenous bone graft. 2. Radiographs 3 views, right wrist.  SURGICAL INDICATIONS:  Ms. Shari Golden is an 79 year old right-hand-dominant female who had previously undergone a proximal row carpectomy for Kienbock disease.  Several years later, she had advancing arthritis and patient would like to undergo the above procedure.  Risks, benefits, and alternatives were discussed in detail with the patient and signed informed consent was obtained.  Risks include, but not limited to bleeding, infection, damage to nearby nerves, arteries, or tendons, loss of motion in wrist and digits, incomplete relief of symptoms, incomplete union, and need for further surgical intervention.  DESCRIPTION OF PROCEDURE:  The patient was properly identified in the preop holding area and mark with a permanent marker was made on the right wrist to indicate correct operative site.  The patient was then brought back to the operating room and placed supine on anesthesia room table.  General anesthesia was administered.  The patient tolerated this well.  A well-padded tourniquet was then placed in a right brachium and sealed with 1000 drape.  Right upper  extremity was then prepped and draped in normal sterile fashion.  Time-out was called.  Correct site was identified, and procedure then begun.  Attention was then turned to the right upper extremity where the longitudinal incision was made directly from the previous proximal row carpectomy was then extended proximally and distally.  Tourniquet was then insufflated.  Dissection was carried down through the skin and subcutaneous tissue.  Between the interval, between the 4th and 2nd dorsal retinaculum was then incised longitudinally and these going through the floor of the 4th and 2nd dorsal compartments.  The capsule was then exposed.  Longitudinal incision was made in the capsule from the long finger metacarpal to the radius.  Large capsular flaps were then carefully elevated.  The patient did have the advanced arthritis really no cartilage on the capitate and the radiolunate process.  Following this, the removal of the cartilage was then carried out the remaining portion of the capitate as well as the long finger, capitate CMC joint.  This was taken back to healthy cancellous bleeding bone.  The radial capitate and CMC joint were then denudated into the articular surfaces.  Osteotomes were then used to remove the bone and for application of a Synthes plate.  This was harvested for local bone graft.  A small window was then made in the distal radius and metaphyseal bone was then harvested  for local autogenous bone grafting.  Once the articular surfaces were then prepared, the Synthes short fusion plate was then used.  This was placed distally with a 2.7-mm nonlocking screw centrally in the metacarpal shaft.  The plate was then adjusted proximally.  It was then loaded in compression with a 3.5-mm nonlocking screw proximally.  Once the bone graft had been packed into the areas into the long finger CMC joint and the radial capitate joint.  After packing of the bone graft, again it was loaded  in compression.  Screws were then placed both proximally and distally, a combination of locking and nonlocking screws.  One locking screw was also placed into the capitate, 4 screws proximally, 1 in the capitate and 3 screws distally.  The wound was then irrigated.  Several millimeters with the Synthes DBX material was then packed over the cancellous surfaces dorsally.  The tourniquet had been deflated.  The capsule was then closed with 2-0 Vicryl.  The thorough irrigation done throughout.  The retinaculum was closed with 2-0 Vicryl and then the subcutaneous tissues closed with 4-0 Vicryl and the skin closed with a running 4-0 Prolene and nylon suture.  Adaptic dressing, sterile compressive bandage then applied.  The patient was then placed in a well- molded sugar-tong splint, extubated, and taken to recovery room in good condition.  POSTPROCEDURE PLAN:  The patient will be admitted overnight for IV antibiotics and pain control, discharged in the morning.  I plan to see her back in approximately 2 weeks, wound check, suture removal, x-rays, application of short-arm cast for total 6 weeks, and then likely removal of orthosis.  Radiographs at each visit.  SURGICAL IMPLANT:  Synthes short wrist fusion plate with a combination of locking and nonlocking screws.  Radiographs 3 views of the wrist did show the wrist fusion plate with good position in both planes.     Madelynn Done, MD     FWO/MEDQ  D:  04/01/2013  T:  04/02/2013  Job:  [id number]

## 2013-04-02 NOTE — Care Management Note (Signed)
CARE MANAGEMENT NOTE 04/02/2013  Patient:  Shari Golden, Shari Golden   Account Number:  [id number]  Date Initiated:  04/02/2013  Documentation initiated by:  Vance Peper  Subjective/Objective Assessment:   79 yr old female, s/p right wrist arthrodesis     Action/Plan:   CM spoke with patient and daughter  concerning home health and DME needs at discharge.Choice offered.   Anticipated DC Date:  04/02/2013   Anticipated DC Plan:  HOME W HOME HEALTH SERVICES      DC Planning Services  CM consult      PAC Choice  DURABLE MEDICAL EQUIPMENT  HOME HEALTH   Choice offered to / List presented to:  C-1 Patient   DME arranged  [facility]      DME agency  [facility]     HH arranged  HH-2 PT  HH-3 OT      [facility] agency  [facility]   Status of service:  Completed, signed off Medicare Important Message given?   (If response is "NO", the following Medicare IM given date fields will be blank) Date Medicare IM given:   Date Additional Medicare IM given:    Discharge Disposition:  HOME W HOME HEALTH SERVICES  Per UR Regulation:    If discussed at Long Length of Stay Meetings, dates discussed:    Comments:

## 2013-04-27 ENCOUNTER — Ambulatory Visit: Payer: Medicare Other | Attending: Orthopedic Surgery | Admitting: *Deleted

## 2013-04-27 DIAGNOSIS — IMO0001 Reserved for inherently not codable concepts without codable children: Secondary | ICD-10-CM | POA: Insufficient documentation

## 2013-04-27 DIAGNOSIS — R5381 Other malaise: Secondary | ICD-10-CM | POA: Insufficient documentation

## 2013-04-27 DIAGNOSIS — M545 Low back pain, unspecified: Secondary | ICD-10-CM | POA: Insufficient documentation

## 2013-04-30 ENCOUNTER — Ambulatory Visit: Payer: Medicare Other | Admitting: Physical Therapy

## 2013-05-06 ENCOUNTER — Ambulatory Visit: Payer: Medicare Other | Admitting: Physical Therapy

## 2013-10-23 ENCOUNTER — Other Ambulatory Visit: Payer: Self-pay | Admitting: *Deleted

## 2013-10-28 ENCOUNTER — Encounter: Payer: Self-pay | Admitting: Cardiology

## 2013-11-07 ENCOUNTER — Encounter: Payer: Self-pay | Admitting: Cardiology

## 2013-11-10 ENCOUNTER — Other Ambulatory Visit (HOSPITAL_COMMUNITY): Payer: Medicare Other

## 2013-11-10 ENCOUNTER — Ambulatory Visit: Payer: Medicare Other | Admitting: Family

## 2013-11-10 ENCOUNTER — Other Ambulatory Visit: Payer: Medicare Other

## 2013-11-10 ENCOUNTER — Ambulatory Visit (INDEPENDENT_AMBULATORY_CARE_PROVIDER_SITE_OTHER): Payer: Medicare Other | Admitting: Cardiology

## 2013-11-10 ENCOUNTER — Encounter: Payer: Self-pay | Admitting: Cardiology

## 2013-11-10 ENCOUNTER — Ambulatory Visit: Payer: Medicare Other | Admitting: Neurosurgery

## 2013-11-10 VITALS — BP 157/77 | HR 72 | Ht 69.0 in | Wt 166.0 lb

## 2013-11-10 DIAGNOSIS — I779 Disorder of arteries and arterioles, unspecified: Secondary | ICD-10-CM

## 2013-11-10 DIAGNOSIS — I059 Rheumatic mitral valve disease, unspecified: Secondary | ICD-10-CM

## 2013-11-10 DIAGNOSIS — I340 Nonrheumatic mitral (valve) insufficiency: Secondary | ICD-10-CM

## 2013-11-10 DIAGNOSIS — I4891 Unspecified atrial fibrillation: Secondary | ICD-10-CM

## 2013-11-10 DIAGNOSIS — I10 Essential (primary) hypertension: Secondary | ICD-10-CM

## 2013-11-10 NOTE — Assessment & Plan Note (Signed)
We have not seen any return of atrial fibrillation. No further workup.

## 2013-11-10 NOTE — Progress Notes (Signed)
HPI  Patient is seen today to followup history of chest pain and paroxysmal atrial fibrillation and mild mitral regurgitation. I saw her last November, 2012. She has been stable. She checks her blood pressure at home and it is well controlled. She's been followed by vascular surgery with followup carotid Dopplers. These have been stable. She's not having any significant palpitations.  Allergies  Allergen Reactions  . Erythromycin     REACTION: Dehydration  . Iodine     REACTION: rash, dizzy  . Iohexol      Code: HIVES, Desc: IODINE BASED CONTRAST   . Latex     REACTION: Swelling (If touched on face will have facial swelling)  . Penicillins     REACTION: Rash    Current Outpatient Prescriptions  Medication Sig Dispense Refill  . aspirin 81 MG chewable tablet Chew 81 mg by mouth daily.      . benazepril (LOTENSIN) 40 MG tablet Take 40 mg by mouth daily.       . carboxymethylcellulose (REFRESH PLUS) 0.5 % SOLN Place 1 drop into both eyes 3 (three) times daily as needed (dry eyes).      Marland Kitchen diltiazem (TIAZAC) 360 MG 24 hr capsule Take 360 mg by mouth daily.      . ferrous sulfate 325 (65 FE) MG tablet Take 325 mg by mouth daily with breakfast.      . furosemide (LASIX) 40 MG tablet Take 20-40 mg by mouth daily. Depending on swelling      . insulin glargine (LANTUS) 100 UNIT/ML injection Inject 10-15 Units into the skin 4 (four) times daily as needed (sugar level). Over 185 takes 10 units, CBG of 250 takes 15 units      . LORazepam (ATIVAN) 0.5 MG tablet Take 0.5 mg by mouth daily.       . multivitamin (RENA-VIT) TABS tablet Take 1 tablet by mouth daily.        . pantoprazole (PROTONIX) 40 MG tablet Take 40 mg by mouth daily.        Marland Kitchen PATADAY 0.2 % SOLN Place 1 drop into both eyes 2 (two) times daily as needed (allergy eyes).       . polyethylene glycol powder (GLYCOLAX/MIRALAX) powder Take 17 g by mouth as needed.       . simvastatin (ZOCOR) 20 MG tablet Take 20 mg by mouth at  bedtime.        . vitamin C (ASCORBIC ACID) 500 MG tablet Take 1 tablet (500 mg total) by mouth daily.  90 tablet  0   No current facility-administered medications for this visit.    History   Social History  . Marital Status: Widowed    Spouse Name: N/A    Number of Children: N/A  . Years of Education: N/A   Occupational History  . Not on file.   Social History Main Topics  . Smoking status: Never Smoker   . Smokeless tobacco: Former Neurosurgeon    Types: Snuff     Comment: 04/01/2013 "smoked 3 cigarettes in 1956; stopped; quit dipping > 20 yr ago"  . Alcohol Use: No  . Drug Use: No  . Sexual Activity: No   Other Topics Concern  . Not on file   Social History Narrative  . No narrative on file    Family History  Problem Relation Age of Onset  . Diabetes Sister   . Stroke Neg Hx   . Coronary artery disease Neg Hx  Past Medical History  Diagnosis Date  . Hypertension   . Anemia   . Constipation 07/18/2009  . GERD (gastroesophageal reflux disease)   . Peripheral neuropathy   . Carotid artery disease     Dr. Hart Rochester, consult done by him August, 2010, his protocol to follow the patient  . Chest pain     2004, catheterization, normal coronaries /  nuclear May, 2010, no ischemia atrial fibrillation  . Atrial fibrillation     ??? In the past, not a recurring problem.  . Ejection fraction     EF 60-65%, echo, June, 2010  . Mitral regurgitation     Mild, echo, 2010  . Atrial septal aneurysm     Echo, 2010   . Edema     Chronic  . Anxiety   . Chronic kidney disease     Nephrologist Dr. Fausto Skillern  . Type II diabetes mellitus   . Heart murmur   . Arthritis     "right wrist" (04/01/2013)    Past Surgical History  Procedure Laterality Date  . Abdominal hysterectomy    . Appendectomy    . Carpal tunnel release      bilateral  . Back surgery    . Tonsillectomy  2012    Laminectomy  . Wrist fusion Right 04/01/2013  . Shoulder arthroscopy w/ rotator cuff repair Left    . Cardiac catheterization    . Lumbar laminectomy  ~ 2011  . Wrist fusion Right 04/01/2013    Procedure: RIGHT WRIST ARTHRODESIS WITH POSSIBLE LOCAL BONE GRAFT;  Surgeon: Sharma Covert, MD;  Location: [facility] OR;  Service: Orthopedics;  Laterality: Right;    Patient Active Problem List   Diagnosis Date Noted  . Occlusion and stenosis of carotid artery without mention of cerebral infarction 11/04/2012  . Diabetes mellitus 10/24/2011  . Hypertension   . Chronic kidney disease   . GERD (gastroesophageal reflux disease)   . Peripheral neuropathy   . Chest pain   . Atrial fibrillation   . Ejection fraction   . Mitral regurgitation   . Atrial septal aneurysm   . Edema   . Anxiety   . Carotid artery disease     ROS   Patient denies fever, chills, headache, sweats, rash, change in vision, change in hearing, chest pain, cough, nausea or vomiting, urinary symptoms. All other systems are reviewed and are negative.  PHYSICAL EXAM  Patient is oriented to person time and place. Affect is normal. There is no jugulovenous distention. Lungs are clear. Respiratory effort is nonlabored. Cardiac exam reveals S1 and S2. There are no clicks. There is a very soft systolic murmur. The abdomen is soft. There is no peripheral edema.  Filed Vitals:   11/10/13 1009  BP: 157/77  Pulse: 72  Height: 5\' 9"  (1.753 m)  Weight: 166 lb (75.297 kg)   I reviewed the EKG in the record from April, 2014. There was normal sinus rhythm with no significant change.  ASSESSMENT & PLAN

## 2013-11-10 NOTE — Assessment & Plan Note (Signed)
Her carotids are followed carefully by vascular surgery.

## 2013-11-10 NOTE — Assessment & Plan Note (Signed)
The patient has had no return her for her chest tightness. No further workup.

## 2013-11-10 NOTE — Patient Instructions (Signed)

## 2013-11-10 NOTE — Assessment & Plan Note (Signed)
Blood pressure is a little high today here but it is normal at home. No change in therapy.

## 2013-11-10 NOTE — Assessment & Plan Note (Signed)
She has mild mitral regurgitation by history. She does not need a followup echo. Hope consider this in one year.

## 2013-11-16 ENCOUNTER — Encounter: Payer: Self-pay | Admitting: Family

## 2013-11-17 ENCOUNTER — Encounter: Payer: Self-pay | Admitting: Family

## 2013-11-17 ENCOUNTER — Ambulatory Visit (HOSPITAL_COMMUNITY)
Admission: RE | Admit: 2013-11-17 | Discharge: 2013-11-17 | Disposition: A | Discharge status: Home / Self Care | Payer: Medicare Other | Source: Ambulatory Visit | Attending: Family | Admitting: Family

## 2013-11-17 ENCOUNTER — Encounter: Payer: Self-pay | Admitting: Cardiology

## 2013-11-17 ENCOUNTER — Ambulatory Visit (INDEPENDENT_AMBULATORY_CARE_PROVIDER_SITE_OTHER): Payer: Medicare Other | Admitting: Family

## 2013-11-17 DIAGNOSIS — I6529 Occlusion and stenosis of unspecified carotid artery: Secondary | ICD-10-CM

## 2013-11-17 DIAGNOSIS — R29898 Other symptoms and signs involving the musculoskeletal system: Secondary | ICD-10-CM

## 2013-11-17 DIAGNOSIS — I658 Occlusion and stenosis of other precerebral arteries: Secondary | ICD-10-CM | POA: Insufficient documentation

## 2013-11-17 DIAGNOSIS — R209 Unspecified disturbances of skin sensation: Secondary | ICD-10-CM

## 2013-11-17 DIAGNOSIS — R208 Other disturbances of skin sensation: Secondary | ICD-10-CM | POA: Insufficient documentation

## 2013-11-17 NOTE — Patient Instructions (Signed)
Stroke Prevention Some medical conditions and behaviors are associated with an increased chance of having a stroke. You may prevent a stroke by making healthy choices and managing medical conditions. Reduce your risk of having a stroke by:  Staying physically active. Get at least 30 minutes of activity on most or all days.  Not smoking. It may also be helpful to avoid exposure to secondhand smoke.  Limiting alcohol use. Moderate alcohol use is considered to be:  No more than 2 drinks per day for men.  No more than 1 drink per day for nonpregnant women.  Eating healthy foods.  Include 5 or more servings of fruits and vegetables a day.  Certain diets may be prescribed to address high blood pressure, high cholesterol, diabetes, or obesity.  Managing your cholesterol levels.  A low-saturated fat, low-trans fat, low-cholesterol, and high-fiber diet may control cholesterol levels.  Take any prescribed medicines to control cholesterol as directed by your caregiver.  Managing your diabetes.  A controlled-carbohydrate, controlled-sugar diet is recommended to manage diabetes.  Take any prescribed medicines to control diabetes as directed by your caregiver.  Controlling your high blood pressure (hypertension).  A low-salt (sodium), low-saturated fat, low-trans fat, and low-cholesterol diet is recommended to manage high blood pressure.  Take any prescribed medicines to control hypertension as directed by your caregiver.  Maintaining a healthy weight.  A reduced-calorie, low-sodium, low-saturated fat, low-trans fat, low-cholesterol diet is recommended to manage weight.  Stopping drug abuse.  Avoiding birth control pills.  Talk to your caregiver about the risks of taking birth control pills if you are over 35 years old, smoke, get migraines, or have ever had a blood clot.  Getting evaluated for sleep disorders (sleep apnea).  Talk to your caregiver about getting a sleep evaluation  if you snore a lot or have excessive sleepiness.  Taking medicines as directed by your caregiver.  For some people, aspirin or blood thinners (anticoagulants) are helpful in reducing the risk of forming abnormal blood clots that can lead to stroke. If you have the irregular heart rhythm of atrial fibrillation, you should be on a blood thinner unless there is a good reason you cannot take them.  Understand all your medicine instructions. SEEK IMMEDIATE MEDICAL CARE IF:   You have sudden weakness or numbness of the face, arm, or leg, especially on one side of the body.  You have sudden confusion.  You have trouble speaking (aphasia) or understanding.  You have sudden trouble seeing in one or both eyes.  You have sudden trouble walking.  You have dizziness.  You have a loss of balance or coordination.  You have a sudden, severe headache with no known cause.  You have new chest pain or an irregular heartbeat. Any of these symptoms may represent a serious problem that is an emergency. Do not wait to see if the symptoms will go away. Get medical help right away. Call your local emergency services (911 in [place] Do not drive yourself to the hospital. Document Released: 01/03/2005 Document Revised: 02/18/2012 Document Reviewed: 05/29/2013 [employer] Patient Information 2014 [employer].  

## 2013-11-17 NOTE — Addendum Note (Signed)
Addended by: Sharee Pimple on: 11/17/2013 12:28 PM   Modules accepted: Orders

## 2013-11-17 NOTE — Progress Notes (Signed)
Established Carotid Patient  History of Present Illness  Shari Golden is a 79 y.o. female patient of Dr. Hart Rochester who has known carotid stenosis.  Patient has not had previous CEA or ICA stent.  Patient has Negative history of TIA or stroke symptom.  The patient denies amaurosis fugax or monocular blindness.  The patient  denies facial drooping.  Pt. denies hemiplegia.  The patient denies receptive or expressive aphasia.  Pt. denies extremity weakness.   Reports New Medical or Surgical History: right hand fusion of the metacarpals after a fracture. States she walks a great deal.  Pt Diabetic: Yes, states in control Pt smoker: non-smoker  Pt meds include: Statin : Yes ASA: Yes Other anticoagulants/antiplatelets: no   Past Medical History  Diagnosis Date  . Hypertension   . Anemia   . Constipation 07/18/2009  . GERD (gastroesophageal reflux disease)   . Peripheral neuropathy   . Carotid artery disease     Dr. Hart Rochester, consult done by him August, 2010, his protocol to follow the patient  . Chest pain     2004, catheterization, normal coronaries /  nuclear May, 2010, no ischemia atrial fibrillation  . Atrial fibrillation     ??? In the past, not a recurring problem.  . Ejection fraction     EF 60-65%, echo, June, 2010  . Mitral regurgitation     Mild, echo, 2010  . Atrial septal aneurysm     Echo, 2010   . Edema     Chronic  . Anxiety   . Chronic kidney disease     Nephrologist Dr. Fausto Skillern  . Type II diabetes mellitus   . Heart murmur   . Arthritis     "right wrist" (04/01/2013)    Social History History  Substance Use Topics  . Smoking status: Never Smoker   . Smokeless tobacco: Former Neurosurgeon    Types: Snuff     Comment: 04/01/2013 "smoked 3 cigarettes in 1956; stopped; quit dipping > 20 yr ago"  . Alcohol Use: No    Family History Family History  Problem Relation Age of Onset  . Diabetes Sister   . Stroke Neg Hx   . Coronary artery disease Neg Hx      Surgical History Past Surgical History  Procedure Laterality Date  . Abdominal hysterectomy    . Appendectomy    . Carpal tunnel release      bilateral  . Back surgery    . Tonsillectomy  2012    Laminectomy  . Wrist fusion Right 04/01/2013  . Shoulder arthroscopy w/ rotator cuff repair Left   . Cardiac catheterization    . Lumbar laminectomy  ~ 2011  . Wrist fusion Right 04/01/2013    Procedure: RIGHT WRIST ARTHRODESIS WITH POSSIBLE LOCAL BONE GRAFT;  Surgeon: Sharma Covert, MD;  Location: [facility] OR;  Service: Orthopedics;  Laterality: Right;    Allergies  Allergen Reactions  . Erythromycin     REACTION: Dehydration  . Iodine     REACTION: rash, dizzy  . Iohexol      Code: HIVES, Desc: IODINE BASED CONTRAST   . Latex     REACTION: Swelling (If touched on face will have facial swelling)  . Penicillins     REACTION: Rash    Current Outpatient Prescriptions  Medication Sig Dispense Refill  . aspirin 81 MG chewable tablet Chew 81 mg by mouth daily.      . benazepril (LOTENSIN) 40 MG tablet Take 40 mg by  mouth daily.       . carboxymethylcellulose (REFRESH PLUS) 0.5 % SOLN Place 1 drop into both eyes 3 (three) times daily as needed (dry eyes).      Marland Kitchen diltiazem (TIAZAC) 360 MG 24 hr capsule Take 360 mg by mouth daily.      . ferrous sulfate 325 (65 FE) MG tablet Take 325 mg by mouth daily with breakfast.      . furosemide (LASIX) 40 MG tablet Take 20-40 mg by mouth daily. Depending on swelling      . insulin glargine (LANTUS) 100 UNIT/ML injection Inject 10-15 Units into the skin 4 (four) times daily as needed (sugar level). Over 185 takes 10 units, CBG of 250 takes 15 units      . LORazepam (ATIVAN) 0.5 MG tablet Take 0.5 mg by mouth daily.       . multivitamin (RENA-VIT) TABS tablet Take 1 tablet by mouth daily.        . pantoprazole (PROTONIX) 40 MG tablet Take 40 mg by mouth daily.        Marland Kitchen PATADAY 0.2 % SOLN Place 1 drop into both eyes 2 (two) times daily as needed  (allergy eyes).       . polyethylene glycol powder (GLYCOLAX/MIRALAX) powder Take 17 g by mouth as needed.       . simvastatin (ZOCOR) 20 MG tablet Take 20 mg by mouth at bedtime.        . vitamin C (ASCORBIC ACID) 500 MG tablet Take 1 tablet (500 mg total) by mouth daily.  90 tablet  0   No current facility-administered medications for this visit.    Physical Examination  Filed Vitals:   11/17/13 1147  BP: 160/79  Pulse: 65  Resp: 16   Filed Weights   11/17/13 1147  Weight: 162 lb (73.483 kg)   Body mass index is 23.91 kg/(m^2).   General: WDWN female in NAD GAIT: normal Eyes: PERRLA Pulmonary:  CTAB, Negative  Rales, Negative rhonchi, & Negative wheezing.  Cardiac: regular Rhythm ,  Negative Murmur detected.  VASCULAR EXAM Carotid Bruits Left Right   Negative Negative   Radial pulses are 2+ palpable and equal.                                                                                                                            LE Pulses LEFT RIGHT       POPLITEAL  not palpable   not palpable       POSTERIOR TIBIAL   palpable    palpable        DORSALIS PEDIS      ANTERIOR TIBIAL  palpable   palpable     Gastrointestinal: soft, nontender, BS WNL, no r/g,  negative masses.  Musculoskeletal: Negative muscle atrophy/wasting. M/S 5/5 throughout, Extremities without ischemic changes.  Neurologic: A&O X 3; Appropriate Affect ; SENSATION ;normal;  Speech is normal CN 2-12 intact, Pain and light touch  intact in extremities, Motor exam as listed above.   Non-Invasive Vascular Imaging CAROTID DUPLEX 11/17/2013   Right ICA: <40% stenosis. Left ICA: <40% stenosis.  These findings are Unchanged from previous exam.  Assessment: Shari Golden is a 79 y.o. female who presents with asymptomatic <40% Bilateral ICA  Stenosis. The  ICA stenosis is  Unchanged from previous exam.  Plan: Follow-up in 1 year with Carotid Duplex scan.   I discussed in depth with  the patient the nature of atherosclerosis, and emphasized the importance of maximal medical management including strict control of blood pressure, blood glucose, and lipid levels, obtaining regular exercise, and continued cessation of smoking.  The patient is aware that without maximal medical management the underlying atherosclerotic disease process will progress, limiting the benefit of any interventions. The patient was given information about stroke prevention and what symptoms should prompt the patient to seek immediate medical care. Thank you for allowing [place] to participate in this patient's care.  Charisse March, RN, MSN, FNP-C [facility] Office: [phone]  Clinic Physician: Hart Rochester  11/17/2013 10:56 AM

## 2014-10-15 ENCOUNTER — Encounter: Payer: Self-pay | Admitting: Cardiology

## 2014-11-12 ENCOUNTER — Encounter: Payer: Self-pay | Admitting: Cardiology

## 2014-11-16 ENCOUNTER — Other Ambulatory Visit (HOSPITAL_COMMUNITY): Payer: Medicare Other

## 2014-11-16 ENCOUNTER — Ambulatory Visit: Payer: Medicare Other | Admitting: Family

## 2014-11-16 ENCOUNTER — Ambulatory Visit: Payer: Medicare Other | Admitting: Cardiology

## 2014-12-13 DIAGNOSIS — M1990 Unspecified osteoarthritis, unspecified site: Secondary | ICD-10-CM | POA: Diagnosis not present

## 2014-12-13 DIAGNOSIS — R32 Unspecified urinary incontinence: Secondary | ICD-10-CM | POA: Diagnosis not present

## 2014-12-15 ENCOUNTER — Ambulatory Visit (INDEPENDENT_AMBULATORY_CARE_PROVIDER_SITE_OTHER): Payer: 59 | Admitting: Cardiology

## 2014-12-15 ENCOUNTER — Encounter: Payer: Self-pay | Admitting: Cardiology

## 2014-12-15 VITALS — BP 140/66 | HR 80 | Ht 66.0 in | Wt 162.0 lb

## 2014-12-15 DIAGNOSIS — I779 Disorder of arteries and arterioles, unspecified: Secondary | ICD-10-CM

## 2014-12-15 DIAGNOSIS — I10 Essential (primary) hypertension: Secondary | ICD-10-CM

## 2014-12-15 DIAGNOSIS — I739 Peripheral vascular disease, unspecified: Secondary | ICD-10-CM

## 2014-12-15 DIAGNOSIS — R0789 Other chest pain: Secondary | ICD-10-CM

## 2014-12-15 NOTE — Progress Notes (Signed)
Patient ID: Shari Golden, female   DOB: March 25, 1930, 81 y.o.   MRN: [id number]    HPI Patient is seen to follow-up with history of some chest discomfort. She had a remote episode of atrial fibrillation with no recurrence over the years. She has not required anticoagulation. She has some carotid artery disease that is followed by vascular surgery and is stable.   Allergies  Allergen Reactions  . Erythromycin     REACTION: Dehydration  . Iodine     REACTION: rash, dizzy  . Iohexol      Code: HIVES, Desc: IODINE BASED CONTRAST   . Latex     REACTION: Swelling (If touched on face will have facial swelling)  . Penicillins     REACTION: Rash    Current Outpatient Prescriptions  Medication Sig Dispense Refill  . aspirin 81 MG chewable tablet Chew 81 mg by mouth daily.    . benazepril (LOTENSIN) 40 MG tablet Take 40 mg by mouth daily.     . carboxymethylcellulose (REFRESH PLUS) 0.5 % SOLN Place 1 drop into both eyes 3 (three) times daily as needed (dry eyes).    Marland Kitchen diltiazem (TIAZAC) 360 MG 24 hr capsule Take 360 mg by mouth daily.    . ferrous sulfate 325 (65 FE) MG tablet Take 325 mg by mouth daily with breakfast.    . furosemide (LASIX) 40 MG tablet Take 20-40 mg by mouth daily. Depending on swelling    . insulin glargine (LANTUS) 100 UNIT/ML injection Inject 10-15 Units into the skin 4 (four) times daily as needed (sugar level). Over 185 takes 10 units, CBG of 250 takes 15 units    . LORazepam (ATIVAN) 0.5 MG tablet Take 0.5 mg by mouth daily.     . multivitamin (RENA-VIT) TABS tablet Take 1 tablet by mouth daily.      . pantoprazole (PROTONIX) 40 MG tablet Take 40 mg by mouth daily.      Marland Kitchen PATADAY 0.2 % SOLN Place 1 drop into both eyes 2 (two) times daily as needed (allergy eyes).     . polyethylene glycol powder (GLYCOLAX/MIRALAX) powder Take 17 g by mouth as needed.     . simvastatin (ZOCOR) 20 MG tablet Take 20 mg by mouth at bedtime.      . vitamin C (ASCORBIC ACID) 500 MG tablet  Take 1 tablet (500 mg total) by mouth daily. 90 tablet 0   No current facility-administered medications for this visit.    History   Social History  . Marital Status: Widowed    Spouse Name: N/A    Number of Children: N/A  . Years of Education: N/A   Occupational History  . Not on file.   Social History Main Topics  . Smoking status: Never Smoker   . Smokeless tobacco: Former Neurosurgeon    Types: Snuff     Comment: 04/01/2013 "smoked 3 cigarettes in 1956; stopped; quit dipping > 20 yr ago"  . Alcohol Use: No  . Drug Use: No  . Sexual Activity: No   Other Topics Concern  . Not on file   Social History Narrative    Family History  Problem Relation Age of Onset  . Diabetes Sister   . Heart disease Sister   . Stroke Neg Hx   . Coronary artery disease Neg Hx   . Cancer Father     Prostate    Past Medical History  Diagnosis Date  . Hypertension   . Anemia   .  Constipation 07/18/2009  . GERD (gastroesophageal reflux disease)   . Peripheral neuropathy   . Carotid artery disease     Dr. Hart RochesterLawson, consult done by him August, 2010, his protocol to follow the patient  . Chest pain     2004, catheterization, normal coronaries /  nuclear May, 2010, no ischemia atrial fibrillation  . Atrial fibrillation     ??? In the past, not a recurring problem.  . Ejection fraction     EF 60-65%, echo, June, 2010  . Mitral regurgitation     Mild, echo, 2010  . Atrial septal aneurysm     Echo, 2010   . Edema     Chronic  . Anxiety   . Chronic kidney disease     Nephrologist Dr. Fausto SkillernBefakadu  . Type II diabetes mellitus   . Heart murmur   . Arthritis     "right wrist" (04/01/2013)    Past Surgical History  Procedure Laterality Date  . Abdominal hysterectomy    . Appendectomy    . Carpal tunnel release      bilateral  . Back surgery    . Tonsillectomy  2012    Laminectomy  . Wrist fusion Right 04/01/2013  . Shoulder arthroscopy w/ rotator cuff repair Left   . Cardiac catheterization     . Lumbar laminectomy  ~ 2011  . Wrist fusion Right 04/01/2013    Procedure: RIGHT WRIST ARTHRODESIS WITH POSSIBLE LOCAL BONE GRAFT;  Surgeon: Sharma CovertFred W Ortmann, MD;  Location: [facility] OR;  Service: Orthopedics;  Laterality: Right;  . Spine surgery      Patient Active Problem List   Diagnosis Date Noted  . Burning sensation-right arm/wrist 11/17/2013  . Weakness of wrist-Right, and arm 11/17/2013  . Occlusion and stenosis of carotid artery without mention of cerebral infarction 11/04/2012  . Diabetes mellitus 10/24/2011  . Hypertension   . Chronic kidney disease   . GERD (gastroesophageal reflux disease)   . Peripheral neuropathy   . Chest tightness   . Atrial fibrillation   . Ejection fraction   . Mitral regurgitation   . Atrial septal aneurysm   . Edema   . Anxiety   . Carotid artery disease     ROS  Patient denies fever, chills, headache, sweats, rash, change in vision, change in hearing, chest pain, cough, nausea or vomiting, urinary symptoms. All other systems are reviewed and are negative.  PHYSICAL EXAM Patient is stable. She is oriented to person time and place. Affect is normal. Head is atraumatic. Sclera and conjunctiva are normal. There is no jugular venous distention. Lungs are clear. Respiratory effort is not labored. Cardiac exam reveals S1 and S2. The abdomen is soft. There is no peripheral edema.  Filed Vitals:   12/15/14 0954  BP: 140/66  Pulse: 80  Height: 5\' 6"  (1.676 m)  Weight: 162 lb (73.483 kg)  SpO2: 98%   EKG is done today and reviewed by me. There is normal sinus rhythm. There are nonspecific ST-T wave changes. There is no change from the past.  ASSESSMENT & PLAN

## 2014-12-15 NOTE — Patient Instructions (Signed)
Your physician recommends that you schedule a follow-up appointment in: 1 year. You will receive a reminder letter in the mail in about 11 months reminding you to call and schedule your appointment. If you don't receive this letter, please contact our office. Your physician recommends that you continue on your current medications as directed. Please refer to the Current Medication list given to you today. 

## 2014-12-15 NOTE — Assessment & Plan Note (Signed)
She has not had any recurrent chest discomfort. No further workup.

## 2014-12-15 NOTE — Assessment & Plan Note (Signed)
She is followed by vascular surgery. She is stable.

## 2014-12-15 NOTE — Assessment & Plan Note (Signed)
Her creatinine has been worsening. She is followed by nephrology.

## 2014-12-16 DIAGNOSIS — D638 Anemia in other chronic diseases classified elsewhere: Secondary | ICD-10-CM | POA: Diagnosis not present

## 2014-12-16 DIAGNOSIS — N183 Chronic kidney disease, stage 3 (moderate): Secondary | ICD-10-CM | POA: Diagnosis not present

## 2014-12-20 DIAGNOSIS — M4807 Spinal stenosis, lumbosacral region: Secondary | ICD-10-CM | POA: Diagnosis not present

## 2014-12-20 DIAGNOSIS — M5136 Other intervertebral disc degeneration, lumbar region: Secondary | ICD-10-CM | POA: Diagnosis not present

## 2014-12-20 DIAGNOSIS — M961 Postlaminectomy syndrome, not elsewhere classified: Secondary | ICD-10-CM | POA: Diagnosis not present

## 2014-12-29 DIAGNOSIS — L609 Nail disorder, unspecified: Secondary | ICD-10-CM | POA: Diagnosis not present

## 2014-12-29 DIAGNOSIS — E1142 Type 2 diabetes mellitus with diabetic polyneuropathy: Secondary | ICD-10-CM | POA: Diagnosis not present

## 2014-12-29 DIAGNOSIS — E1140 Type 2 diabetes mellitus with diabetic neuropathy, unspecified: Secondary | ICD-10-CM | POA: Diagnosis not present

## 2014-12-29 DIAGNOSIS — L110 Acquired keratosis follicularis: Secondary | ICD-10-CM | POA: Diagnosis not present

## 2015-01-06 DIAGNOSIS — R042 Hemoptysis: Secondary | ICD-10-CM | POA: Diagnosis not present

## 2015-01-06 DIAGNOSIS — R05 Cough: Secondary | ICD-10-CM | POA: Diagnosis not present

## 2015-01-06 DIAGNOSIS — K210 Gastro-esophageal reflux disease with esophagitis: Secondary | ICD-10-CM | POA: Diagnosis not present

## 2015-01-11 DIAGNOSIS — I10 Essential (primary) hypertension: Secondary | ICD-10-CM | POA: Diagnosis not present

## 2015-01-11 DIAGNOSIS — E780 Pure hypercholesterolemia: Secondary | ICD-10-CM | POA: Diagnosis not present

## 2015-01-11 DIAGNOSIS — R634 Abnormal weight loss: Secondary | ICD-10-CM | POA: Diagnosis not present

## 2015-01-11 DIAGNOSIS — E119 Type 2 diabetes mellitus without complications: Secondary | ICD-10-CM | POA: Diagnosis not present

## 2015-01-12 DIAGNOSIS — E875 Hyperkalemia: Secondary | ICD-10-CM | POA: Diagnosis not present

## 2015-01-12 DIAGNOSIS — N183 Chronic kidney disease, stage 3 (moderate): Secondary | ICD-10-CM | POA: Diagnosis not present

## 2015-01-12 DIAGNOSIS — D638 Anemia in other chronic diseases classified elsewhere: Secondary | ICD-10-CM | POA: Diagnosis not present

## 2015-01-12 DIAGNOSIS — I10 Essential (primary) hypertension: Secondary | ICD-10-CM | POA: Diagnosis not present

## 2015-01-13 DIAGNOSIS — M1990 Unspecified osteoarthritis, unspecified site: Secondary | ICD-10-CM | POA: Diagnosis not present

## 2015-01-13 DIAGNOSIS — R32 Unspecified urinary incontinence: Secondary | ICD-10-CM | POA: Diagnosis not present

## 2015-01-18 DIAGNOSIS — R809 Proteinuria, unspecified: Secondary | ICD-10-CM | POA: Diagnosis not present

## 2015-01-18 DIAGNOSIS — N183 Chronic kidney disease, stage 3 (moderate): Secondary | ICD-10-CM | POA: Diagnosis not present

## 2015-01-18 DIAGNOSIS — I129 Hypertensive chronic kidney disease with stage 1 through stage 4 chronic kidney disease, or unspecified chronic kidney disease: Secondary | ICD-10-CM | POA: Diagnosis not present

## 2015-01-18 DIAGNOSIS — D509 Iron deficiency anemia, unspecified: Secondary | ICD-10-CM | POA: Diagnosis not present

## 2015-01-18 DIAGNOSIS — E559 Vitamin D deficiency, unspecified: Secondary | ICD-10-CM | POA: Diagnosis not present

## 2015-01-18 DIAGNOSIS — Z79899 Other long term (current) drug therapy: Secondary | ICD-10-CM | POA: Diagnosis not present

## 2015-01-26 DIAGNOSIS — G4733 Obstructive sleep apnea (adult) (pediatric): Secondary | ICD-10-CM | POA: Diagnosis not present

## 2015-01-26 DIAGNOSIS — E782 Mixed hyperlipidemia: Secondary | ICD-10-CM | POA: Diagnosis not present

## 2015-01-26 DIAGNOSIS — Z1389 Encounter for screening for other disorder: Secondary | ICD-10-CM | POA: Diagnosis not present

## 2015-01-26 DIAGNOSIS — D638 Anemia in other chronic diseases classified elsewhere: Secondary | ICD-10-CM | POA: Diagnosis not present

## 2015-01-26 DIAGNOSIS — E1122 Type 2 diabetes mellitus with diabetic chronic kidney disease: Secondary | ICD-10-CM | POA: Diagnosis not present

## 2015-01-26 DIAGNOSIS — E871 Hypo-osmolality and hyponatremia: Secondary | ICD-10-CM | POA: Diagnosis not present

## 2015-02-11 DIAGNOSIS — R32 Unspecified urinary incontinence: Secondary | ICD-10-CM | POA: Diagnosis not present

## 2015-02-11 DIAGNOSIS — M1990 Unspecified osteoarthritis, unspecified site: Secondary | ICD-10-CM | POA: Diagnosis not present

## 2015-02-23 DIAGNOSIS — D509 Iron deficiency anemia, unspecified: Secondary | ICD-10-CM | POA: Diagnosis not present

## 2015-02-23 DIAGNOSIS — N183 Chronic kidney disease, stage 3 (moderate): Secondary | ICD-10-CM | POA: Diagnosis not present

## 2015-02-23 DIAGNOSIS — Z79899 Other long term (current) drug therapy: Secondary | ICD-10-CM | POA: Diagnosis not present

## 2015-03-08 DIAGNOSIS — Z872 Personal history of diseases of the skin and subcutaneous tissue: Secondary | ICD-10-CM | POA: Diagnosis not present

## 2015-03-14 DIAGNOSIS — M1990 Unspecified osteoarthritis, unspecified site: Secondary | ICD-10-CM | POA: Diagnosis not present

## 2015-03-14 DIAGNOSIS — R32 Unspecified urinary incontinence: Secondary | ICD-10-CM | POA: Diagnosis not present

## 2015-03-16 DIAGNOSIS — L110 Acquired keratosis follicularis: Secondary | ICD-10-CM | POA: Diagnosis not present

## 2015-03-16 DIAGNOSIS — E1142 Type 2 diabetes mellitus with diabetic polyneuropathy: Secondary | ICD-10-CM | POA: Diagnosis not present

## 2015-03-16 DIAGNOSIS — L609 Nail disorder, unspecified: Secondary | ICD-10-CM | POA: Diagnosis not present

## 2015-03-16 DIAGNOSIS — E1140 Type 2 diabetes mellitus with diabetic neuropathy, unspecified: Secondary | ICD-10-CM | POA: Diagnosis not present

## 2015-03-23 DIAGNOSIS — Z79899 Other long term (current) drug therapy: Secondary | ICD-10-CM | POA: Diagnosis not present

## 2015-03-23 DIAGNOSIS — N183 Chronic kidney disease, stage 3 (moderate): Secondary | ICD-10-CM | POA: Diagnosis not present

## 2015-03-24 DIAGNOSIS — M19031 Primary osteoarthritis, right wrist: Secondary | ICD-10-CM | POA: Diagnosis not present

## 2015-03-29 DIAGNOSIS — L299 Pruritus, unspecified: Secondary | ICD-10-CM | POA: Diagnosis not present

## 2015-04-11 DIAGNOSIS — Z4789 Encounter for other orthopedic aftercare: Secondary | ICD-10-CM | POA: Diagnosis not present

## 2015-04-11 DIAGNOSIS — M545 Low back pain: Secondary | ICD-10-CM | POA: Diagnosis not present

## 2015-04-12 DIAGNOSIS — I10 Essential (primary) hypertension: Secondary | ICD-10-CM | POA: Diagnosis not present

## 2015-04-12 DIAGNOSIS — R634 Abnormal weight loss: Secondary | ICD-10-CM | POA: Diagnosis not present

## 2015-04-12 DIAGNOSIS — E119 Type 2 diabetes mellitus without complications: Secondary | ICD-10-CM | POA: Diagnosis not present

## 2015-04-12 DIAGNOSIS — M2550 Pain in unspecified joint: Secondary | ICD-10-CM | POA: Diagnosis not present

## 2015-04-12 DIAGNOSIS — E780 Pure hypercholesterolemia: Secondary | ICD-10-CM | POA: Diagnosis not present

## 2015-04-13 DIAGNOSIS — R32 Unspecified urinary incontinence: Secondary | ICD-10-CM | POA: Diagnosis not present

## 2015-04-13 DIAGNOSIS — M1990 Unspecified osteoarthritis, unspecified site: Secondary | ICD-10-CM | POA: Diagnosis not present

## 2015-04-20 DIAGNOSIS — N183 Chronic kidney disease, stage 3 (moderate): Secondary | ICD-10-CM | POA: Diagnosis not present

## 2015-04-20 DIAGNOSIS — Z79899 Other long term (current) drug therapy: Secondary | ICD-10-CM | POA: Diagnosis not present

## 2015-04-20 DIAGNOSIS — E559 Vitamin D deficiency, unspecified: Secondary | ICD-10-CM | POA: Diagnosis not present

## 2015-04-20 DIAGNOSIS — R809 Proteinuria, unspecified: Secondary | ICD-10-CM | POA: Diagnosis not present

## 2015-04-27 DIAGNOSIS — N183 Chronic kidney disease, stage 3 (moderate): Secondary | ICD-10-CM | POA: Diagnosis not present

## 2015-04-27 DIAGNOSIS — E559 Vitamin D deficiency, unspecified: Secondary | ICD-10-CM | POA: Diagnosis not present

## 2015-04-27 DIAGNOSIS — I10 Essential (primary) hypertension: Secondary | ICD-10-CM | POA: Diagnosis not present

## 2015-04-27 DIAGNOSIS — D638 Anemia in other chronic diseases classified elsewhere: Secondary | ICD-10-CM | POA: Diagnosis not present

## 2015-04-27 DIAGNOSIS — N2581 Secondary hyperparathyroidism of renal origin: Secondary | ICD-10-CM | POA: Diagnosis not present

## 2015-05-13 DIAGNOSIS — R32 Unspecified urinary incontinence: Secondary | ICD-10-CM | POA: Diagnosis not present

## 2015-05-13 DIAGNOSIS — M1990 Unspecified osteoarthritis, unspecified site: Secondary | ICD-10-CM | POA: Diagnosis not present

## 2015-05-20 DIAGNOSIS — K210 Gastro-esophageal reflux disease with esophagitis: Secondary | ICD-10-CM | POA: Diagnosis not present

## 2015-05-20 DIAGNOSIS — I10 Essential (primary) hypertension: Secondary | ICD-10-CM | POA: Diagnosis not present

## 2015-05-20 DIAGNOSIS — D638 Anemia in other chronic diseases classified elsewhere: Secondary | ICD-10-CM | POA: Diagnosis not present

## 2015-05-20 DIAGNOSIS — E119 Type 2 diabetes mellitus without complications: Secondary | ICD-10-CM | POA: Diagnosis not present

## 2015-05-20 DIAGNOSIS — E782 Mixed hyperlipidemia: Secondary | ICD-10-CM | POA: Diagnosis not present

## 2015-05-26 DIAGNOSIS — E559 Vitamin D deficiency, unspecified: Secondary | ICD-10-CM | POA: Diagnosis not present

## 2015-05-26 DIAGNOSIS — R809 Proteinuria, unspecified: Secondary | ICD-10-CM | POA: Diagnosis not present

## 2015-05-26 DIAGNOSIS — N183 Chronic kidney disease, stage 3 (moderate): Secondary | ICD-10-CM | POA: Diagnosis not present

## 2015-05-26 DIAGNOSIS — Z79899 Other long term (current) drug therapy: Secondary | ICD-10-CM | POA: Diagnosis not present

## 2015-05-27 DIAGNOSIS — E782 Mixed hyperlipidemia: Secondary | ICD-10-CM | POA: Diagnosis not present

## 2015-05-27 DIAGNOSIS — D638 Anemia in other chronic diseases classified elsewhere: Secondary | ICD-10-CM | POA: Diagnosis not present

## 2015-05-27 DIAGNOSIS — E1122 Type 2 diabetes mellitus with diabetic chronic kidney disease: Secondary | ICD-10-CM | POA: Diagnosis not present

## 2015-05-27 DIAGNOSIS — G6289 Other specified polyneuropathies: Secondary | ICD-10-CM | POA: Diagnosis not present

## 2015-05-27 DIAGNOSIS — G4733 Obstructive sleep apnea (adult) (pediatric): Secondary | ICD-10-CM | POA: Diagnosis not present

## 2015-06-01 DIAGNOSIS — E119 Type 2 diabetes mellitus without complications: Secondary | ICD-10-CM | POA: Diagnosis not present

## 2015-06-01 DIAGNOSIS — I10 Essential (primary) hypertension: Secondary | ICD-10-CM | POA: Diagnosis not present

## 2015-06-01 DIAGNOSIS — E1140 Type 2 diabetes mellitus with diabetic neuropathy, unspecified: Secondary | ICD-10-CM | POA: Diagnosis not present

## 2015-06-01 DIAGNOSIS — L110 Acquired keratosis follicularis: Secondary | ICD-10-CM | POA: Diagnosis not present

## 2015-06-01 DIAGNOSIS — L609 Nail disorder, unspecified: Secondary | ICD-10-CM | POA: Diagnosis not present

## 2015-06-01 DIAGNOSIS — E1142 Type 2 diabetes mellitus with diabetic polyneuropathy: Secondary | ICD-10-CM | POA: Diagnosis not present

## 2015-06-14 DIAGNOSIS — R32 Unspecified urinary incontinence: Secondary | ICD-10-CM | POA: Diagnosis not present

## 2015-06-14 DIAGNOSIS — M1990 Unspecified osteoarthritis, unspecified site: Secondary | ICD-10-CM | POA: Diagnosis not present

## 2015-07-07 DIAGNOSIS — D509 Iron deficiency anemia, unspecified: Secondary | ICD-10-CM | POA: Diagnosis not present

## 2015-07-07 DIAGNOSIS — N183 Chronic kidney disease, stage 3 (moderate): Secondary | ICD-10-CM | POA: Diagnosis not present

## 2015-07-13 DIAGNOSIS — I10 Essential (primary) hypertension: Secondary | ICD-10-CM | POA: Diagnosis not present

## 2015-07-13 DIAGNOSIS — E119 Type 2 diabetes mellitus without complications: Secondary | ICD-10-CM | POA: Diagnosis not present

## 2015-07-13 DIAGNOSIS — R634 Abnormal weight loss: Secondary | ICD-10-CM | POA: Diagnosis not present

## 2015-07-13 DIAGNOSIS — E780 Pure hypercholesterolemia: Secondary | ICD-10-CM | POA: Diagnosis not present

## 2015-07-14 DIAGNOSIS — R32 Unspecified urinary incontinence: Secondary | ICD-10-CM | POA: Diagnosis not present

## 2015-07-14 DIAGNOSIS — M1990 Unspecified osteoarthritis, unspecified site: Secondary | ICD-10-CM | POA: Diagnosis not present

## 2015-07-30 ENCOUNTER — Emergency Department (HOSPITAL_COMMUNITY)
Admission: EM | Admit: 2015-07-30 | Discharge: 2015-07-30 | Disposition: A | Discharge status: Home / Self Care | Payer: Medicare Other | Attending: Emergency Medicine | Admitting: Emergency Medicine

## 2015-07-30 ENCOUNTER — Encounter (HOSPITAL_COMMUNITY): Payer: Self-pay | Admitting: Cardiology

## 2015-07-30 ENCOUNTER — Emergency Department (HOSPITAL_COMMUNITY): Payer: Medicare Other

## 2015-07-30 DIAGNOSIS — I4891 Unspecified atrial fibrillation: Secondary | ICD-10-CM | POA: Insufficient documentation

## 2015-07-30 DIAGNOSIS — M25551 Pain in right hip: Secondary | ICD-10-CM | POA: Insufficient documentation

## 2015-07-30 DIAGNOSIS — I129 Hypertensive chronic kidney disease with stage 1 through stage 4 chronic kidney disease, or unspecified chronic kidney disease: Secondary | ICD-10-CM | POA: Diagnosis not present

## 2015-07-30 DIAGNOSIS — F419 Anxiety disorder, unspecified: Secondary | ICD-10-CM | POA: Insufficient documentation

## 2015-07-30 DIAGNOSIS — Z7982 Long term (current) use of aspirin: Secondary | ICD-10-CM | POA: Insufficient documentation

## 2015-07-30 DIAGNOSIS — R011 Cardiac murmur, unspecified: Secondary | ICD-10-CM | POA: Insufficient documentation

## 2015-07-30 DIAGNOSIS — Z91040 Latex allergy status: Secondary | ICD-10-CM | POA: Diagnosis not present

## 2015-07-30 DIAGNOSIS — E119 Type 2 diabetes mellitus without complications: Secondary | ICD-10-CM | POA: Insufficient documentation

## 2015-07-30 DIAGNOSIS — M545 Low back pain: Secondary | ICD-10-CM | POA: Diagnosis not present

## 2015-07-30 DIAGNOSIS — Z880 Allergy status to penicillin: Secondary | ICD-10-CM | POA: Diagnosis not present

## 2015-07-30 DIAGNOSIS — K219 Gastro-esophageal reflux disease without esophagitis: Secondary | ICD-10-CM | POA: Diagnosis not present

## 2015-07-30 DIAGNOSIS — Z794 Long term (current) use of insulin: Secondary | ICD-10-CM | POA: Insufficient documentation

## 2015-07-30 DIAGNOSIS — M1611 Unilateral primary osteoarthritis, right hip: Secondary | ICD-10-CM | POA: Diagnosis not present

## 2015-07-30 DIAGNOSIS — D649 Anemia, unspecified: Secondary | ICD-10-CM | POA: Insufficient documentation

## 2015-07-30 DIAGNOSIS — Z8669 Personal history of other diseases of the nervous system and sense organs: Secondary | ICD-10-CM | POA: Diagnosis not present

## 2015-07-30 DIAGNOSIS — N189 Chronic kidney disease, unspecified: Secondary | ICD-10-CM | POA: Diagnosis not present

## 2015-07-30 DIAGNOSIS — M1990 Unspecified osteoarthritis, unspecified site: Secondary | ICD-10-CM | POA: Diagnosis not present

## 2015-07-30 DIAGNOSIS — R52 Pain, unspecified: Secondary | ICD-10-CM | POA: Diagnosis not present

## 2015-07-30 DIAGNOSIS — Z79899 Other long term (current) drug therapy: Secondary | ICD-10-CM | POA: Insufficient documentation

## 2015-07-30 DIAGNOSIS — K5900 Constipation, unspecified: Secondary | ICD-10-CM | POA: Insufficient documentation

## 2015-07-30 MED ORDER — TRAMADOL HCL 50 MG PO TABS: 50.0000 mg | ORAL_TABLET | Freq: Four times a day (QID) | ORAL | Status: DC | PRN

## 2015-07-30 NOTE — ED Provider Notes (Signed)
CSN: [id number]     Arrival date & time 07/30/15  1611 History   First MD Initiated Contact with Patient 07/30/15 1814     Chief Complaint  Patient presents with  . Hip Pain     (Consider location/radiation/quality/duration/timing/severity/associated sxs/prior Treatment) Patient is a 81 y.o. female presenting with hip pain. The history is provided by the patient.  Hip Pain This is a new problem. Pertinent negatives include no chest pain and no shortness of breath.   patient has had pain in her right hip for the last 2 weeks. Reportedly comes and goes. Worse with walking. Worse with palpitations. Does not worsen with sitting down. No trauma. States the pain is just aching. It is just dull. Nothing makes it better. She has not had episodes of this in the past before it began 2 weeks ago.  Past Medical History  Diagnosis Date  . Hypertension   . Anemia   . Constipation 07/18/2009  . GERD (gastroesophageal reflux disease)   . Peripheral neuropathy   . Carotid artery disease     Dr. Hart Rochester, consult done by him August, 2010, his protocol to follow the patient  . Chest pain     2004, catheterization, normal coronaries /  nuclear May, 2010, no ischemia atrial fibrillation  . Atrial fibrillation     ??? In the past, not a recurring problem.  . Ejection fraction     EF 60-65%, echo, June, 2010  . Mitral regurgitation     Mild, echo, 2010  . Atrial septal aneurysm     Echo, 2010   . Edema     Chronic  . Anxiety   . Chronic kidney disease     Nephrologist Dr. Fausto Skillern  . Type II diabetes mellitus   . Heart murmur   . Arthritis     "right wrist" (04/01/2013)   Past Surgical History  Procedure Laterality Date  . Abdominal hysterectomy    . Appendectomy    . Carpal tunnel release      bilateral  . Back surgery    . Tonsillectomy  2012    Laminectomy  . Wrist fusion Right 04/01/2013  . Shoulder arthroscopy w/ rotator cuff repair Left   . Cardiac catheterization    . Lumbar  laminectomy  ~ 2011  . Wrist fusion Right 04/01/2013    Procedure: RIGHT WRIST ARTHRODESIS WITH POSSIBLE LOCAL BONE GRAFT;  Surgeon: Sharma Covert, MD;  Location: [facility] OR;  Service: Orthopedics;  Laterality: Right;  . Spine surgery     Family History  Problem Relation Age of Onset  . Diabetes Sister   . Heart disease Sister   . Stroke Neg Hx   . Coronary artery disease Neg Hx   . Cancer Father     Prostate   Social History  Substance Use Topics  . Smoking status: Never Smoker   . Smokeless tobacco: Former Neurosurgeon    Types: Snuff     Comment: 04/01/2013 "smoked 3 cigarettes in 1956; stopped; quit dipping > 20 yr ago"  . Alcohol Use: No   OB History    No data available     Review of Systems  Constitutional: Negative for fever, chills and fatigue.  Respiratory: Negative for shortness of breath.   Cardiovascular: Negative for chest pain.  Skin: Negative for rash.  Neurological:       Right hip pain      Allergies  Erythromycin; Iodine; Iohexol; Latex; and Penicillins  Home Medications  Prior to Admission medications   Medication Sig Start Date End Date Taking? Authorizing Provider  aspirin 81 MG chewable tablet Chew 81 mg by mouth daily.    Historical Provider, MD  benazepril (LOTENSIN) 40 MG tablet Take 40 mg by mouth daily.     Historical Provider, MD  carboxymethylcellulose (REFRESH PLUS) 0.5 % SOLN Place 1 drop into both eyes 3 (three) times daily as needed (dry eyes).    Historical Provider, MD  diltiazem (TIAZAC) 360 MG 24 hr capsule Take 360 mg by mouth daily.    Historical Provider, MD  ferrous sulfate 325 (65 FE) MG tablet Take 325 mg by mouth daily with breakfast.    Historical Provider, MD  furosemide (LASIX) 40 MG tablet Take 20-40 mg by mouth daily. Depending on swelling    Historical Provider, MD  insulin glargine (LANTUS) 100 UNIT/ML injection Inject 10-15 Units into the skin 4 (four) times daily as needed (sugar level). Over 185 takes 10 units, CBG of 250  takes 15 units    Historical Provider, MD  LORazepam (ATIVAN) 0.5 MG tablet Take 0.5 mg by mouth daily.     Historical Provider, MD  multivitamin (RENA-VIT) TABS tablet Take 1 tablet by mouth daily.      Historical Provider, MD  pantoprazole (PROTONIX) 40 MG tablet Take 40 mg by mouth daily.      Historical Provider, MD  PATADAY 0.2 % SOLN Place 1 drop into both eyes 2 (two) times daily as needed (allergy eyes).  10/27/12   Historical Provider, MD  polyethylene glycol powder (GLYCOLAX/MIRALAX) powder Take 17 g by mouth as needed.  10/12/13   Historical Provider, MD  simvastatin (ZOCOR) 20 MG tablet Take 20 mg by mouth at bedtime.      Historical Provider, MD  traMADol (ULTRAM) 50 MG tablet Take 1 tablet (50 mg total) by mouth every 6 (six) hours as needed. 07/30/15   Benjiman Core, MD  vitamin C (ASCORBIC ACID) 500 MG tablet Take 1 tablet (500 mg total) by mouth daily. 04/02/13   Bradly Bienenstock, MD   BP 129/72 mmHg  Pulse 80  Temp(Src) 98.2 F (36.8 C) (Oral)  Resp 15  Ht 5\' 7"  (1.702 m)  Wt 143 lb (64.864 kg)  BMI 22.39 kg/m2  SpO2 100% Physical Exam  Constitutional: She appears well-developed.  Cardiovascular: Normal rate and regular rhythm.   Pulmonary/Chest: Effort normal.  Abdominal: There is no tenderness.  Musculoskeletal: Normal range of motion.  Some tenderness over right hip laterally. Good range of motion. No ecchymosis. No rash. No tenderness over pelvis. Neurovascularly intact over right foot.  Neurological: She is alert.    ED Course  Procedures (including critical care time) Labs Review Labs Reviewed - No data to display  Imaging Review Dg Hip Unilat With Pelvis 2-3 Views Right  07/30/2015   CLINICAL DATA:  Patient with lateral right hip pain for 1 week. No traumatic injury. Remote surgery.  EXAM: DG HIP (WITH OR WITHOUT PELVIS) 2-3V RIGHT  COMPARISON:  None.  FINDINGS: Lumbar spine degenerative changes. SI joints are unremarkable. Pelvic phleboliths. Mild  degenerative changes right hip joint without evidence for acute fracture.  IMPRESSION: No acute osseous abnormality.   Electronically Signed   By: Annia Belt M.D.   On: 07/30/2015 19:36   I have personally reviewed and evaluated these images and lab results as part of my medical decision-making.   EKG Interpretation None      MDM   Final diagnoses:  Hip  pain, right    Patient with hip pain. Negative x-ray. Benign exam. Will discharge home.    Benjiman Core, MD 07/30/15 2008

## 2015-07-30 NOTE — Discharge Instructions (Signed)
Hip Pain Your hip is the joint between your upper legs and your lower pelvis. The bones, cartilage, tendons, and muscles of your hip joint perform a lot of work each day supporting your body weight and allowing you to move around. Hip pain can range from a minor ache to severe pain in one or both of your hips. Pain may be felt on the inside of the hip joint near the groin, or the outside near the buttocks and upper thigh. You may have swelling or stiffness as well.  HOME CARE INSTRUCTIONS   Take medicines only as directed by your health care provider.  Apply ice to the injured area:  Put ice in a plastic bag.  Place a towel between your skin and the bag.  Leave the ice on for 15-20 minutes at a time, 3-4 times a day.  Keep your leg raised (elevated) when possible to lessen swelling.  Avoid activities that cause pain.  Follow specific exercises as directed by your health care provider.  Sleep with a pillow between your legs on your most comfortable side.  Record how often you have hip pain, the location of the pain, and what it feels like. SEEK MEDICAL CARE IF:   You are unable to put weight on your leg.  Your hip is red or swollen or very tender to touch.  Your pain or swelling continues or worsens after 1 week.  You have increasing difficulty walking.  You have a fever. SEEK IMMEDIATE MEDICAL CARE IF:   You have fallen.  You have a sudden increase in pain and swelling in your hip. MAKE SURE YOU:   Understand these instructions.  Will watch your condition.  Will get help right away if you are not doing well or get worse. Document Released: 05/16/2010 Document Revised: 04/12/2014 Document Reviewed: 07/23/2013 Exit[employer] Patient Information 2015 [employer]. This information is not intended to replace advice given to you by your health care provider. Make sure you discuss any questions you have with your health care provider.  

## 2015-07-30 NOTE — ED Notes (Signed)
Right hip pain since yesterday.  Denies any injury.

## 2015-08-04 DIAGNOSIS — N183 Chronic kidney disease, stage 3 (moderate): Secondary | ICD-10-CM | POA: Diagnosis not present

## 2015-08-04 DIAGNOSIS — D509 Iron deficiency anemia, unspecified: Secondary | ICD-10-CM | POA: Diagnosis not present

## 2015-08-04 DIAGNOSIS — Z79899 Other long term (current) drug therapy: Secondary | ICD-10-CM | POA: Diagnosis not present

## 2015-08-04 DIAGNOSIS — E559 Vitamin D deficiency, unspecified: Secondary | ICD-10-CM | POA: Diagnosis not present

## 2015-08-04 DIAGNOSIS — R809 Proteinuria, unspecified: Secondary | ICD-10-CM | POA: Diagnosis not present

## 2015-08-08 DIAGNOSIS — M7061 Trochanteric bursitis, right hip: Secondary | ICD-10-CM | POA: Diagnosis not present

## 2015-08-09 DIAGNOSIS — B351 Tinea unguium: Secondary | ICD-10-CM | POA: Diagnosis not present

## 2015-08-09 DIAGNOSIS — E1142 Type 2 diabetes mellitus with diabetic polyneuropathy: Secondary | ICD-10-CM | POA: Diagnosis not present

## 2015-08-09 DIAGNOSIS — L84 Corns and callosities: Secondary | ICD-10-CM | POA: Diagnosis not present

## 2015-08-15 DIAGNOSIS — M1990 Unspecified osteoarthritis, unspecified site: Secondary | ICD-10-CM | POA: Diagnosis not present

## 2015-08-15 DIAGNOSIS — R32 Unspecified urinary incontinence: Secondary | ICD-10-CM | POA: Diagnosis not present

## 2015-08-24 DIAGNOSIS — D638 Anemia in other chronic diseases classified elsewhere: Secondary | ICD-10-CM | POA: Diagnosis not present

## 2015-08-24 DIAGNOSIS — N183 Chronic kidney disease, stage 3 (moderate): Secondary | ICD-10-CM | POA: Diagnosis not present

## 2015-08-24 DIAGNOSIS — I10 Essential (primary) hypertension: Secondary | ICD-10-CM | POA: Diagnosis not present

## 2015-08-24 DIAGNOSIS — R809 Proteinuria, unspecified: Secondary | ICD-10-CM | POA: Diagnosis not present

## 2015-08-26 DIAGNOSIS — I10 Essential (primary) hypertension: Secondary | ICD-10-CM | POA: Diagnosis not present

## 2015-08-26 DIAGNOSIS — E1122 Type 2 diabetes mellitus with diabetic chronic kidney disease: Secondary | ICD-10-CM | POA: Diagnosis not present

## 2015-08-26 DIAGNOSIS — N184 Chronic kidney disease, stage 4 (severe): Secondary | ICD-10-CM | POA: Diagnosis not present

## 2015-08-26 DIAGNOSIS — E782 Mixed hyperlipidemia: Secondary | ICD-10-CM | POA: Diagnosis not present

## 2015-08-26 DIAGNOSIS — E871 Hypo-osmolality and hyponatremia: Secondary | ICD-10-CM | POA: Diagnosis not present

## 2015-08-31 DIAGNOSIS — E782 Mixed hyperlipidemia: Secondary | ICD-10-CM | POA: Diagnosis not present

## 2015-08-31 DIAGNOSIS — E1122 Type 2 diabetes mellitus with diabetic chronic kidney disease: Secondary | ICD-10-CM | POA: Diagnosis not present

## 2015-08-31 DIAGNOSIS — D638 Anemia in other chronic diseases classified elsewhere: Secondary | ICD-10-CM | POA: Diagnosis not present

## 2015-08-31 DIAGNOSIS — Z23 Encounter for immunization: Secondary | ICD-10-CM | POA: Diagnosis not present

## 2015-08-31 DIAGNOSIS — E1142 Type 2 diabetes mellitus with diabetic polyneuropathy: Secondary | ICD-10-CM | POA: Diagnosis not present

## 2015-08-31 DIAGNOSIS — G4733 Obstructive sleep apnea (adult) (pediatric): Secondary | ICD-10-CM | POA: Diagnosis not present

## 2015-09-02 DIAGNOSIS — N183 Chronic kidney disease, stage 3 (moderate): Secondary | ICD-10-CM | POA: Diagnosis not present

## 2015-09-02 DIAGNOSIS — Z79899 Other long term (current) drug therapy: Secondary | ICD-10-CM | POA: Diagnosis not present

## 2015-09-02 DIAGNOSIS — R809 Proteinuria, unspecified: Secondary | ICD-10-CM | POA: Diagnosis not present

## 2015-09-02 DIAGNOSIS — E559 Vitamin D deficiency, unspecified: Secondary | ICD-10-CM | POA: Diagnosis not present

## 2015-09-05 DIAGNOSIS — E1122 Type 2 diabetes mellitus with diabetic chronic kidney disease: Secondary | ICD-10-CM | POA: Diagnosis not present

## 2015-09-13 DIAGNOSIS — E119 Type 2 diabetes mellitus without complications: Secondary | ICD-10-CM | POA: Diagnosis not present

## 2015-09-13 DIAGNOSIS — H40003 Preglaucoma, unspecified, bilateral: Secondary | ICD-10-CM | POA: Diagnosis not present

## 2015-09-13 DIAGNOSIS — M1990 Unspecified osteoarthritis, unspecified site: Secondary | ICD-10-CM | POA: Diagnosis not present

## 2015-09-13 DIAGNOSIS — H35372 Puckering of macula, left eye: Secondary | ICD-10-CM | POA: Diagnosis not present

## 2015-09-13 DIAGNOSIS — Z794 Long term (current) use of insulin: Secondary | ICD-10-CM | POA: Diagnosis not present

## 2015-09-20 DIAGNOSIS — R928 Other abnormal and inconclusive findings on diagnostic imaging of breast: Secondary | ICD-10-CM | POA: Diagnosis not present

## 2015-09-20 DIAGNOSIS — Z1231 Encounter for screening mammogram for malignant neoplasm of breast: Secondary | ICD-10-CM | POA: Diagnosis not present

## 2015-09-21 DIAGNOSIS — R269 Unspecified abnormalities of gait and mobility: Secondary | ICD-10-CM | POA: Diagnosis not present

## 2015-09-21 DIAGNOSIS — M25551 Pain in right hip: Secondary | ICD-10-CM | POA: Diagnosis not present

## 2015-09-21 DIAGNOSIS — M6281 Muscle weakness (generalized): Secondary | ICD-10-CM | POA: Diagnosis not present

## 2015-09-21 DIAGNOSIS — M7071 Other bursitis of hip, right hip: Secondary | ICD-10-CM | POA: Diagnosis not present

## 2015-09-23 DIAGNOSIS — R269 Unspecified abnormalities of gait and mobility: Secondary | ICD-10-CM | POA: Diagnosis not present

## 2015-09-23 DIAGNOSIS — M6281 Muscle weakness (generalized): Secondary | ICD-10-CM | POA: Diagnosis not present

## 2015-09-23 DIAGNOSIS — M25551 Pain in right hip: Secondary | ICD-10-CM | POA: Diagnosis not present

## 2015-09-23 DIAGNOSIS — M7071 Other bursitis of hip, right hip: Secondary | ICD-10-CM | POA: Diagnosis not present

## 2015-09-27 DIAGNOSIS — M25551 Pain in right hip: Secondary | ICD-10-CM | POA: Diagnosis not present

## 2015-09-27 DIAGNOSIS — R269 Unspecified abnormalities of gait and mobility: Secondary | ICD-10-CM | POA: Diagnosis not present

## 2015-09-27 DIAGNOSIS — M7071 Other bursitis of hip, right hip: Secondary | ICD-10-CM | POA: Diagnosis not present

## 2015-09-27 DIAGNOSIS — M6281 Muscle weakness (generalized): Secondary | ICD-10-CM | POA: Diagnosis not present

## 2015-09-29 DIAGNOSIS — M7071 Other bursitis of hip, right hip: Secondary | ICD-10-CM | POA: Diagnosis not present

## 2015-09-29 DIAGNOSIS — M6281 Muscle weakness (generalized): Secondary | ICD-10-CM | POA: Diagnosis not present

## 2015-09-29 DIAGNOSIS — R269 Unspecified abnormalities of gait and mobility: Secondary | ICD-10-CM | POA: Diagnosis not present

## 2015-09-29 DIAGNOSIS — M25551 Pain in right hip: Secondary | ICD-10-CM | POA: Diagnosis not present

## 2015-09-30 DIAGNOSIS — N184 Chronic kidney disease, stage 4 (severe): Secondary | ICD-10-CM | POA: Diagnosis not present

## 2015-10-04 DIAGNOSIS — M7071 Other bursitis of hip, right hip: Secondary | ICD-10-CM | POA: Diagnosis not present

## 2015-10-04 DIAGNOSIS — R269 Unspecified abnormalities of gait and mobility: Secondary | ICD-10-CM | POA: Diagnosis not present

## 2015-10-04 DIAGNOSIS — M25551 Pain in right hip: Secondary | ICD-10-CM | POA: Diagnosis not present

## 2015-10-04 DIAGNOSIS — M6281 Muscle weakness (generalized): Secondary | ICD-10-CM | POA: Diagnosis not present

## 2015-10-05 DIAGNOSIS — R928 Other abnormal and inconclusive findings on diagnostic imaging of breast: Secondary | ICD-10-CM | POA: Diagnosis not present

## 2015-10-06 DIAGNOSIS — R269 Unspecified abnormalities of gait and mobility: Secondary | ICD-10-CM | POA: Diagnosis not present

## 2015-10-06 DIAGNOSIS — M6281 Muscle weakness (generalized): Secondary | ICD-10-CM | POA: Diagnosis not present

## 2015-10-06 DIAGNOSIS — M7071 Other bursitis of hip, right hip: Secondary | ICD-10-CM | POA: Diagnosis not present

## 2015-10-06 DIAGNOSIS — M25551 Pain in right hip: Secondary | ICD-10-CM | POA: Diagnosis not present

## 2015-10-07 DIAGNOSIS — L609 Nail disorder, unspecified: Secondary | ICD-10-CM | POA: Diagnosis not present

## 2015-10-12 DIAGNOSIS — I10 Essential (primary) hypertension: Secondary | ICD-10-CM | POA: Diagnosis not present

## 2015-10-12 DIAGNOSIS — E781 Pure hyperglyceridemia: Secondary | ICD-10-CM | POA: Diagnosis not present

## 2015-10-12 DIAGNOSIS — E119 Type 2 diabetes mellitus without complications: Secondary | ICD-10-CM | POA: Diagnosis not present

## 2015-10-12 DIAGNOSIS — R634 Abnormal weight loss: Secondary | ICD-10-CM | POA: Diagnosis not present

## 2015-10-13 DIAGNOSIS — E1142 Type 2 diabetes mellitus with diabetic polyneuropathy: Secondary | ICD-10-CM | POA: Diagnosis not present

## 2015-10-13 DIAGNOSIS — L84 Corns and callosities: Secondary | ICD-10-CM | POA: Diagnosis not present

## 2015-10-13 DIAGNOSIS — B351 Tinea unguium: Secondary | ICD-10-CM | POA: Diagnosis not present

## 2015-10-14 DIAGNOSIS — M19031 Primary osteoarthritis, right wrist: Secondary | ICD-10-CM | POA: Diagnosis not present

## 2015-10-14 DIAGNOSIS — M1990 Unspecified osteoarthritis, unspecified site: Secondary | ICD-10-CM | POA: Diagnosis not present

## 2015-10-18 DIAGNOSIS — M6281 Muscle weakness (generalized): Secondary | ICD-10-CM | POA: Diagnosis not present

## 2015-10-18 DIAGNOSIS — M7071 Other bursitis of hip, right hip: Secondary | ICD-10-CM | POA: Diagnosis not present

## 2015-10-18 DIAGNOSIS — R269 Unspecified abnormalities of gait and mobility: Secondary | ICD-10-CM | POA: Diagnosis not present

## 2015-10-18 DIAGNOSIS — M25551 Pain in right hip: Secondary | ICD-10-CM | POA: Diagnosis not present

## 2015-10-28 DIAGNOSIS — N184 Chronic kidney disease, stage 4 (severe): Secondary | ICD-10-CM | POA: Diagnosis not present

## 2015-11-18 DIAGNOSIS — I10 Essential (primary) hypertension: Secondary | ICD-10-CM | POA: Diagnosis not present

## 2015-11-18 DIAGNOSIS — D638 Anemia in other chronic diseases classified elsewhere: Secondary | ICD-10-CM | POA: Diagnosis not present

## 2015-11-18 DIAGNOSIS — E1122 Type 2 diabetes mellitus with diabetic chronic kidney disease: Secondary | ICD-10-CM | POA: Diagnosis not present

## 2015-11-18 DIAGNOSIS — E782 Mixed hyperlipidemia: Secondary | ICD-10-CM | POA: Diagnosis not present

## 2015-11-18 DIAGNOSIS — E871 Hypo-osmolality and hyponatremia: Secondary | ICD-10-CM | POA: Diagnosis not present

## 2015-11-21 DIAGNOSIS — R32 Unspecified urinary incontinence: Secondary | ICD-10-CM | POA: Diagnosis not present

## 2015-11-21 DIAGNOSIS — M1990 Unspecified osteoarthritis, unspecified site: Secondary | ICD-10-CM | POA: Diagnosis not present

## 2015-11-25 DIAGNOSIS — E782 Mixed hyperlipidemia: Secondary | ICD-10-CM | POA: Diagnosis not present

## 2015-11-25 DIAGNOSIS — E1122 Type 2 diabetes mellitus with diabetic chronic kidney disease: Secondary | ICD-10-CM | POA: Diagnosis not present

## 2015-11-25 DIAGNOSIS — Z0001 Encounter for general adult medical examination with abnormal findings: Secondary | ICD-10-CM | POA: Diagnosis not present

## 2015-11-25 DIAGNOSIS — E1142 Type 2 diabetes mellitus with diabetic polyneuropathy: Secondary | ICD-10-CM | POA: Diagnosis not present

## 2015-11-25 DIAGNOSIS — D638 Anemia in other chronic diseases classified elsewhere: Secondary | ICD-10-CM | POA: Diagnosis not present

## 2015-12-01 DIAGNOSIS — Z79899 Other long term (current) drug therapy: Secondary | ICD-10-CM | POA: Diagnosis not present

## 2015-12-01 DIAGNOSIS — E559 Vitamin D deficiency, unspecified: Secondary | ICD-10-CM | POA: Diagnosis not present

## 2015-12-01 DIAGNOSIS — R809 Proteinuria, unspecified: Secondary | ICD-10-CM | POA: Diagnosis not present

## 2015-12-01 DIAGNOSIS — N184 Chronic kidney disease, stage 4 (severe): Secondary | ICD-10-CM | POA: Diagnosis not present

## 2015-12-14 DIAGNOSIS — R32 Unspecified urinary incontinence: Secondary | ICD-10-CM | POA: Diagnosis not present

## 2015-12-14 DIAGNOSIS — D649 Anemia, unspecified: Secondary | ICD-10-CM | POA: Diagnosis not present

## 2015-12-14 DIAGNOSIS — N183 Chronic kidney disease, stage 3 (moderate): Secondary | ICD-10-CM | POA: Diagnosis not present

## 2015-12-14 DIAGNOSIS — R809 Proteinuria, unspecified: Secondary | ICD-10-CM | POA: Diagnosis not present

## 2015-12-14 DIAGNOSIS — M1990 Unspecified osteoarthritis, unspecified site: Secondary | ICD-10-CM | POA: Diagnosis not present

## 2015-12-14 DIAGNOSIS — I10 Essential (primary) hypertension: Secondary | ICD-10-CM | POA: Diagnosis not present

## 2015-12-15 DIAGNOSIS — N184 Chronic kidney disease, stage 4 (severe): Secondary | ICD-10-CM | POA: Diagnosis not present

## 2015-12-16 ENCOUNTER — Encounter: Payer: Medicare Other | Admitting: Cardiology

## 2015-12-16 ENCOUNTER — Encounter: Payer: Self-pay | Admitting: Cardiology

## 2015-12-16 NOTE — Progress Notes (Signed)
No show  This encounter was created in error - please disregard.

## 2015-12-20 DIAGNOSIS — L84 Corns and callosities: Secondary | ICD-10-CM | POA: Diagnosis not present

## 2015-12-20 DIAGNOSIS — B351 Tinea unguium: Secondary | ICD-10-CM | POA: Diagnosis not present

## 2015-12-20 DIAGNOSIS — E1142 Type 2 diabetes mellitus with diabetic polyneuropathy: Secondary | ICD-10-CM | POA: Diagnosis not present

## 2015-12-23 ENCOUNTER — Encounter: Payer: Self-pay | Admitting: Cardiology

## 2015-12-23 ENCOUNTER — Ambulatory Visit (INDEPENDENT_AMBULATORY_CARE_PROVIDER_SITE_OTHER): Payer: Medicare Other | Admitting: Cardiology

## 2015-12-23 VITALS — BP 136/60 | HR 74 | Ht 66.0 in | Wt 143.6 lb

## 2015-12-23 DIAGNOSIS — I779 Disorder of arteries and arterioles, unspecified: Secondary | ICD-10-CM | POA: Diagnosis not present

## 2015-12-23 DIAGNOSIS — Z136 Encounter for screening for cardiovascular disorders: Secondary | ICD-10-CM

## 2015-12-23 DIAGNOSIS — N183 Chronic kidney disease, stage 3 unspecified: Secondary | ICD-10-CM

## 2015-12-23 DIAGNOSIS — I10 Essential (primary) hypertension: Secondary | ICD-10-CM

## 2015-12-23 DIAGNOSIS — Z87898 Personal history of other specified conditions: Secondary | ICD-10-CM

## 2015-12-23 DIAGNOSIS — Z8679 Personal history of other diseases of the circulatory system: Secondary | ICD-10-CM

## 2015-12-23 DIAGNOSIS — I739 Peripheral vascular disease, unspecified: Principal | ICD-10-CM

## 2015-12-23 DIAGNOSIS — E785 Hyperlipidemia, unspecified: Secondary | ICD-10-CM

## 2015-12-23 NOTE — Patient Instructions (Signed)
Your physician recommends that you continue on your current medications as directed. Please refer to the Current Medication list given to you today. Your physician has requested that you have a carotid duplex. This test is an ultrasound of the carotid arteries in your neck. It looks at blood flow through these arteries that supply the brain with blood. Allow one hour for this exam. There are no restrictions or special instructions. Your physician recommends that you schedule a follow-up appointment in: 1 year. You will receive a reminder letter in the mail in about 10 months reminding you to call and schedule your appointment. If you don't receive this letter, please contact our office. 

## 2015-12-23 NOTE — Progress Notes (Signed)
Cardiology Office Note  Date: 12/23/2015   ID: Shari Golden, DOB 12-01-1930, MRN [id number]  PCP: Donzetta Sprung, MD  Primary Cardiologist: Nona Dell, MD   Chief Complaint  Patient presents with  . History of atrial fibrillation    History of Present Illness: Shari Golden is an 82 y.o. female former patient of Dr. Myrtis Ser, now establishing follow-up with me. I reviewed her records and updated in the chart. She last saw Dr. Myrtis Ser in January 2016. She presents today for a routine visit.  She denies any recent chest discomfort or palpitations. Chart review finds prior episodes of chest pain, however normal coronary arteries documented in 2004. ECG today shows sinus rhythm with nonspecific T-wave changes.  She has a remote history of atrial fibrillation, although none documented in several years. She has not been anticoagulated. Medications are reviewed below. She continues on aspirin and Cardizem CD.  Last follow-up with [facility] was in 2014. At that time she had less than 40% bilateral ICA stenoses.  I could not find a follow-up carotid Doppler since then.  She tells me that she follows up with Dr. Reuel Boom 3-4 times a year and has routine lab work through his practice. She also has renal insufficiency and sees Dr. Kristian Covey.  Past Medical History  Diagnosis Date  . Essential hypertension   . Anemia   . Constipation   . GERD (gastroesophageal reflux disease)   . Peripheral neuropathy (HCC)   . Carotid artery disease (HCC)     Dr. Hart Rochester  . History of cardiac catheterization     Normal coronaries 2004  . Atrial fibrillation (HCC)     Remotely documented without recurrence  . Anxiety   . CKD (chronic kidney disease), stage III     Dr. Fausto Skillern  . Type II diabetes mellitus (HCC)   . Arthritis     Past Surgical History  Procedure Laterality Date  . Abdominal hysterectomy    . Appendectomy    . Carpal tunnel release Bilateral   . Tonsillectomy  2012  . Shoulder  arthroscopy w/ rotator cuff repair Left   . Lumbar laminectomy  ~ 2011  . Wrist fusion Right 04/01/2013    Procedure: RIGHT WRIST ARTHRODESIS WITH POSSIBLE LOCAL BONE GRAFT;  Surgeon: Sharma Covert, MD;  Location: [facility] OR;  Service: Orthopedics;  Laterality: Right;    Current Outpatient Prescriptions  Medication Sig Dispense Refill  . aspirin 81 MG chewable tablet Chew 81 mg by mouth daily.    . benazepril (LOTENSIN) 40 MG tablet Take 40 mg by mouth daily.     . carboxymethylcellulose (REFRESH PLUS) 0.5 % SOLN Place 1 drop into both eyes 3 (three) times daily as needed (dry eyes).    Marland Kitchen diltiazem (TIAZAC) 360 MG 24 hr capsule Take 360 mg by mouth daily.    . ferrous sulfate 325 (65 FE) MG tablet Take 325 mg by mouth daily with breakfast.    . furosemide (LASIX) 40 MG tablet Take 20-40 mg by mouth daily. Depending on swelling    . insulin glargine (LANTUS) 100 UNIT/ML injection Inject 10-15 Units into the skin 4 (four) times daily as needed (sugar level - sliding scale). Over 185 takes 10 units, CBG of 250 takes 15 units    . LORazepam (ATIVAN) 0.5 MG tablet Take 0.5 mg by mouth daily.     . multivitamin (RENA-VIT) TABS tablet Take 1 tablet by mouth daily.      . pantoprazole (PROTONIX) 40 MG  tablet Take 40 mg by mouth daily.      Marland Kitchen. PATADAY 0.2 % SOLN Place 1 drop into both eyes 2 (two) times daily as needed (allergy eyes).     . polyethylene glycol powder (GLYCOLAX/MIRALAX) powder Take 17 g by mouth as needed.     . simvastatin (ZOCOR) 20 MG tablet Take 20 mg by mouth at bedtime.      . traMADol (ULTRAM) 50 MG tablet Take 1 tablet (50 mg total) by mouth every 6 (six) hours as needed. 8 tablet 0  . vitamin C (ASCORBIC ACID) 500 MG tablet Take 1 tablet (500 mg total) by mouth daily. 90 tablet 0   No current facility-administered medications for this visit.   Allergies:  Erythromycin; Iodine; Iohexol; Latex; and Penicillins   Social History: The patient  reports that she has never smoked. She  has quit using smokeless tobacco. Her smokeless tobacco use included Snuff. She reports that she does not drink alcohol or use illicit drugs.   ROS:  Please see the history of present illness. Otherwise, complete review of systems is positive for arthritis pains and peripheral neuropathy symptoms.  All other systems are reviewed and negative.   Physical Exam: VS:  BP 136/60 mmHg  Pulse 74  Ht 5\' 6"  (1.676 m)  Wt 143 lb 9.6 oz (65.137 kg)  BMI 23.19 kg/m2  SpO2 98%, BMI Body mass index is 23.19 kg/(m^2).  Wt Readings from Last 3 Encounters:  12/23/15 143 lb 9.6 oz (65.137 kg)  07/30/15 143 lb (64.864 kg)  12/15/14 162 lb (73.483 kg)    General: Thin elderly woman, appears comfortable at rest. HEENT: Conjunctiva and lids normal, oropharynx clear. Neck: Supple, no elevated JVP,  Left carotid bruit, no thyromegaly. Lungs: Clear to auscultation, nonlabored breathing at rest. Cardiac: Regular rate and rhythm, no S3, soft systolic murmur, no pericardial rub. Abdomen: Soft, nontender, bowel sounds present, no guarding or rebound. Extremities: No pitting edema, distal pulses 2+. Skin: Warm and dry. Musculoskeletal: No kyphosis. Neuropsychiatric: Alert and oriented x3, affect grossly appropriate.  ECG:  Tracing from 12/15/2014 showed normal sinus rhythm with nonspecific T-wave changes.  Recent Labwork:  December 2015: BUN 53, creatinine 2.3 , potassium 4.3 , hemoglobin 9.6  Other Studies Reviewed Today:  Echocardiogram 06/06/2009 [facility] Mild LVH with LVEF 60-65%, mild diastolic dysfunction, mild left atrial enlargement, mild mitral regurgitation, sclerotic aortic valve without stenosis, mild tricuspid regurgitation with RVSP 30 mmHg.  Assessment and Plan:  1. Remote history of atrial fibrillation, none documented in several years. She does not report any palpitations. ECG shows sinus rhythm. She has not been anticoagulated over the years but continues on aspirin and Cardizem CD.  Continue observation for now.  2. Previous history of chest pain with documentation of normal coronary arteries. She denies any recent symptoms since her last follow-up with Dr. Myrtis SerKatz.  3. Essential hypertension, blood pressure is reasonably well controlled today.  4. Bilateral carotid artery disease, less than 40% as of carotid Dopplers in 2014. Follow-up study will be arranged. She is on aspirin and statin.  5. Hyperlipidemia, on Zocor. She follows regularly with Dr. Reuel Boomaniel.  6. CKD stage 3, follows with Dr. Kristian CoveyBefekadu.  Current medicines were reviewed with the patient today.   Orders Placed This Encounter  Procedures  . EKG 12-Lead    Disposition: FU with me in 1 year.   Signed, Jonelle SidleSamuel G. Sheriden Archibeque, MD, [facility] 12/23/2015 11:13 AM    [facility] [address]  [address], [place], [place] [address] Phone: [phone]; Fax: [phone]

## 2015-12-29 ENCOUNTER — Ambulatory Visit: Payer: Medicare Other

## 2015-12-29 DIAGNOSIS — I6523 Occlusion and stenosis of bilateral carotid arteries: Secondary | ICD-10-CM | POA: Diagnosis not present

## 2015-12-29 DIAGNOSIS — I779 Disorder of arteries and arterioles, unspecified: Secondary | ICD-10-CM

## 2015-12-29 DIAGNOSIS — N184 Chronic kidney disease, stage 4 (severe): Secondary | ICD-10-CM | POA: Diagnosis not present

## 2015-12-29 DIAGNOSIS — I739 Peripheral vascular disease, unspecified: Principal | ICD-10-CM

## 2016-01-03 ENCOUNTER — Telehealth: Payer: Self-pay | Admitting: *Deleted

## 2016-01-03 DIAGNOSIS — D509 Iron deficiency anemia, unspecified: Secondary | ICD-10-CM | POA: Diagnosis not present

## 2016-01-03 NOTE — Telephone Encounter (Signed)
-----   Message from Jonelle Sidle, MD sent at 01/02/2016 10:13 AM EST ----- Reviewed report. 1-39% bilateral ICA stenosis, stable findings overall. Continue medical therapy.

## 2016-01-05 NOTE — Telephone Encounter (Signed)
Patient informed. 

## 2016-01-10 DIAGNOSIS — D509 Iron deficiency anemia, unspecified: Secondary | ICD-10-CM | POA: Diagnosis not present

## 2016-01-12 DIAGNOSIS — D509 Iron deficiency anemia, unspecified: Secondary | ICD-10-CM | POA: Diagnosis not present

## 2016-01-12 DIAGNOSIS — N184 Chronic kidney disease, stage 4 (severe): Secondary | ICD-10-CM | POA: Diagnosis not present

## 2016-01-14 DIAGNOSIS — R32 Unspecified urinary incontinence: Secondary | ICD-10-CM | POA: Diagnosis not present

## 2016-01-14 DIAGNOSIS — M1990 Unspecified osteoarthritis, unspecified site: Secondary | ICD-10-CM | POA: Diagnosis not present

## 2016-01-20 DIAGNOSIS — N390 Urinary tract infection, site not specified: Secondary | ICD-10-CM | POA: Diagnosis not present

## 2016-02-01 DIAGNOSIS — E1122 Type 2 diabetes mellitus with diabetic chronic kidney disease: Secondary | ICD-10-CM | POA: Diagnosis not present

## 2016-02-09 DIAGNOSIS — N184 Chronic kidney disease, stage 4 (severe): Secondary | ICD-10-CM | POA: Diagnosis not present

## 2016-02-09 DIAGNOSIS — D509 Iron deficiency anemia, unspecified: Secondary | ICD-10-CM | POA: Diagnosis not present

## 2016-02-11 DIAGNOSIS — M1990 Unspecified osteoarthritis, unspecified site: Secondary | ICD-10-CM | POA: Diagnosis not present

## 2016-02-11 DIAGNOSIS — R32 Unspecified urinary incontinence: Secondary | ICD-10-CM | POA: Diagnosis not present

## 2016-02-23 DIAGNOSIS — N184 Chronic kidney disease, stage 4 (severe): Secondary | ICD-10-CM | POA: Diagnosis not present

## 2016-03-13 DIAGNOSIS — E119 Type 2 diabetes mellitus without complications: Secondary | ICD-10-CM | POA: Diagnosis not present

## 2016-03-20 DIAGNOSIS — E782 Mixed hyperlipidemia: Secondary | ICD-10-CM | POA: Diagnosis not present

## 2016-03-20 DIAGNOSIS — I10 Essential (primary) hypertension: Secondary | ICD-10-CM | POA: Diagnosis not present

## 2016-03-20 DIAGNOSIS — D638 Anemia in other chronic diseases classified elsewhere: Secondary | ICD-10-CM | POA: Diagnosis not present

## 2016-03-20 DIAGNOSIS — K210 Gastro-esophageal reflux disease with esophagitis: Secondary | ICD-10-CM | POA: Diagnosis not present

## 2016-03-20 DIAGNOSIS — E1122 Type 2 diabetes mellitus with diabetic chronic kidney disease: Secondary | ICD-10-CM | POA: Diagnosis not present

## 2016-03-29 DIAGNOSIS — E782 Mixed hyperlipidemia: Secondary | ICD-10-CM | POA: Diagnosis not present

## 2016-03-29 DIAGNOSIS — G4733 Obstructive sleep apnea (adult) (pediatric): Secondary | ICD-10-CM | POA: Diagnosis not present

## 2016-03-29 DIAGNOSIS — E1142 Type 2 diabetes mellitus with diabetic polyneuropathy: Secondary | ICD-10-CM | POA: Diagnosis not present

## 2016-03-29 DIAGNOSIS — D638 Anemia in other chronic diseases classified elsewhere: Secondary | ICD-10-CM | POA: Diagnosis not present

## 2016-03-29 DIAGNOSIS — E1122 Type 2 diabetes mellitus with diabetic chronic kidney disease: Secondary | ICD-10-CM | POA: Diagnosis not present

## 2016-04-02 ENCOUNTER — Other Ambulatory Visit: Payer: Self-pay | Admitting: Family Medicine

## 2016-04-02 DIAGNOSIS — F0390 Unspecified dementia without behavioral disturbance: Secondary | ICD-10-CM

## 2016-04-17 DIAGNOSIS — I10 Essential (primary) hypertension: Secondary | ICD-10-CM | POA: Diagnosis not present

## 2016-04-17 DIAGNOSIS — E1122 Type 2 diabetes mellitus with diabetic chronic kidney disease: Secondary | ICD-10-CM | POA: Diagnosis not present

## 2016-04-23 DIAGNOSIS — I10 Essential (primary) hypertension: Secondary | ICD-10-CM | POA: Diagnosis not present

## 2016-04-23 DIAGNOSIS — R32 Unspecified urinary incontinence: Secondary | ICD-10-CM | POA: Diagnosis not present

## 2016-04-23 DIAGNOSIS — E1122 Type 2 diabetes mellitus with diabetic chronic kidney disease: Secondary | ICD-10-CM | POA: Diagnosis not present

## 2016-04-27 ENCOUNTER — Telehealth: Payer: Self-pay | Admitting: Family Medicine

## 2016-04-27 NOTE — Telephone Encounter (Signed)
APT. REMINDER CALL, LMTCB °

## 2016-04-30 ENCOUNTER — Ambulatory Visit
Admission: RE | Admit: 2016-04-30 | Discharge: 2016-04-30 | Disposition: A | Discharge status: Home / Self Care | Payer: Medicare Other | Source: Ambulatory Visit | Attending: Family Medicine | Admitting: Family Medicine

## 2016-04-30 ENCOUNTER — Ambulatory Visit: Payer: Medicare Other | Admitting: Internal Medicine

## 2016-04-30 DIAGNOSIS — F0390 Unspecified dementia without behavioral disturbance: Secondary | ICD-10-CM

## 2016-04-30 DIAGNOSIS — R413 Other amnesia: Secondary | ICD-10-CM | POA: Diagnosis not present

## 2016-05-18 DIAGNOSIS — I10 Essential (primary) hypertension: Secondary | ICD-10-CM | POA: Diagnosis not present

## 2016-05-18 DIAGNOSIS — E1122 Type 2 diabetes mellitus with diabetic chronic kidney disease: Secondary | ICD-10-CM | POA: Diagnosis not present

## 2016-06-15 DIAGNOSIS — K591 Functional diarrhea: Secondary | ICD-10-CM | POA: Diagnosis not present

## 2016-06-15 DIAGNOSIS — Z6820 Body mass index (BMI) 20.0-20.9, adult: Secondary | ICD-10-CM | POA: Diagnosis not present

## 2016-06-15 DIAGNOSIS — R630 Anorexia: Secondary | ICD-10-CM | POA: Diagnosis not present

## 2016-06-19 DIAGNOSIS — E1122 Type 2 diabetes mellitus with diabetic chronic kidney disease: Secondary | ICD-10-CM | POA: Diagnosis not present

## 2016-06-19 DIAGNOSIS — I10 Essential (primary) hypertension: Secondary | ICD-10-CM | POA: Diagnosis not present

## 2016-06-20 DIAGNOSIS — E875 Hyperkalemia: Secondary | ICD-10-CM | POA: Diagnosis not present

## 2016-07-17 DIAGNOSIS — E1165 Type 2 diabetes mellitus with hyperglycemia: Secondary | ICD-10-CM | POA: Diagnosis not present

## 2016-07-17 DIAGNOSIS — R51 Headache: Secondary | ICD-10-CM | POA: Diagnosis not present

## 2016-07-17 DIAGNOSIS — E119 Type 2 diabetes mellitus without complications: Secondary | ICD-10-CM | POA: Diagnosis not present

## 2016-07-17 DIAGNOSIS — I10 Essential (primary) hypertension: Secondary | ICD-10-CM | POA: Diagnosis not present

## 2016-07-17 DIAGNOSIS — E7800 Pure hypercholesterolemia, unspecified: Secondary | ICD-10-CM | POA: Diagnosis not present

## 2016-07-17 DIAGNOSIS — E270 Other adrenocortical overactivity: Secondary | ICD-10-CM | POA: Diagnosis not present

## 2016-07-18 DIAGNOSIS — E1122 Type 2 diabetes mellitus with diabetic chronic kidney disease: Secondary | ICD-10-CM | POA: Diagnosis not present

## 2016-07-18 DIAGNOSIS — I10 Essential (primary) hypertension: Secondary | ICD-10-CM | POA: Diagnosis not present

## 2016-08-08 DIAGNOSIS — G4733 Obstructive sleep apnea (adult) (pediatric): Secondary | ICD-10-CM | POA: Diagnosis not present

## 2016-08-08 DIAGNOSIS — D638 Anemia in other chronic diseases classified elsewhere: Secondary | ICD-10-CM | POA: Diagnosis not present

## 2016-08-08 DIAGNOSIS — E782 Mixed hyperlipidemia: Secondary | ICD-10-CM | POA: Diagnosis not present

## 2016-08-08 DIAGNOSIS — E1142 Type 2 diabetes mellitus with diabetic polyneuropathy: Secondary | ICD-10-CM | POA: Diagnosis not present

## 2016-08-08 DIAGNOSIS — E1122 Type 2 diabetes mellitus with diabetic chronic kidney disease: Secondary | ICD-10-CM | POA: Diagnosis not present

## 2016-08-17 DIAGNOSIS — I10 Essential (primary) hypertension: Secondary | ICD-10-CM | POA: Diagnosis not present

## 2016-08-17 DIAGNOSIS — E1122 Type 2 diabetes mellitus with diabetic chronic kidney disease: Secondary | ICD-10-CM | POA: Diagnosis not present

## 2016-08-23 DIAGNOSIS — E1122 Type 2 diabetes mellitus with diabetic chronic kidney disease: Secondary | ICD-10-CM | POA: Diagnosis not present

## 2016-08-23 DIAGNOSIS — I10 Essential (primary) hypertension: Secondary | ICD-10-CM | POA: Diagnosis not present

## 2016-08-23 DIAGNOSIS — E1165 Type 2 diabetes mellitus with hyperglycemia: Secondary | ICD-10-CM | POA: Diagnosis not present

## 2016-08-24 DIAGNOSIS — Z23 Encounter for immunization: Secondary | ICD-10-CM | POA: Diagnosis not present

## 2016-08-30 DIAGNOSIS — I10 Essential (primary) hypertension: Secondary | ICD-10-CM | POA: Diagnosis not present

## 2016-08-30 DIAGNOSIS — E1165 Type 2 diabetes mellitus with hyperglycemia: Secondary | ICD-10-CM | POA: Diagnosis not present

## 2016-08-30 DIAGNOSIS — E7800 Pure hypercholesterolemia, unspecified: Secondary | ICD-10-CM | POA: Diagnosis not present

## 2016-09-18 DIAGNOSIS — E1122 Type 2 diabetes mellitus with diabetic chronic kidney disease: Secondary | ICD-10-CM | POA: Diagnosis not present

## 2016-09-18 DIAGNOSIS — I10 Essential (primary) hypertension: Secondary | ICD-10-CM | POA: Diagnosis not present

## 2016-09-19 DIAGNOSIS — E7800 Pure hypercholesterolemia, unspecified: Secondary | ICD-10-CM | POA: Diagnosis not present

## 2016-09-19 DIAGNOSIS — I10 Essential (primary) hypertension: Secondary | ICD-10-CM | POA: Diagnosis not present

## 2016-09-19 DIAGNOSIS — E1165 Type 2 diabetes mellitus with hyperglycemia: Secondary | ICD-10-CM | POA: Diagnosis not present

## 2016-10-16 DIAGNOSIS — E119 Type 2 diabetes mellitus without complications: Secondary | ICD-10-CM | POA: Diagnosis not present

## 2016-10-25 DIAGNOSIS — I10 Essential (primary) hypertension: Secondary | ICD-10-CM | POA: Diagnosis not present

## 2016-10-25 DIAGNOSIS — E1122 Type 2 diabetes mellitus with diabetic chronic kidney disease: Secondary | ICD-10-CM | POA: Diagnosis not present

## 2016-11-08 DIAGNOSIS — D638 Anemia in other chronic diseases classified elsewhere: Secondary | ICD-10-CM | POA: Diagnosis not present

## 2016-11-08 DIAGNOSIS — E0580 Other thyrotoxicosis without thyrotoxic crisis or storm: Secondary | ICD-10-CM | POA: Diagnosis not present

## 2016-11-08 DIAGNOSIS — E1122 Type 2 diabetes mellitus with diabetic chronic kidney disease: Secondary | ICD-10-CM | POA: Diagnosis not present

## 2016-11-08 DIAGNOSIS — G4733 Obstructive sleep apnea (adult) (pediatric): Secondary | ICD-10-CM | POA: Diagnosis not present

## 2016-11-08 DIAGNOSIS — E782 Mixed hyperlipidemia: Secondary | ICD-10-CM | POA: Diagnosis not present

## 2016-11-08 DIAGNOSIS — E1142 Type 2 diabetes mellitus with diabetic polyneuropathy: Secondary | ICD-10-CM | POA: Diagnosis not present

## 2016-11-13 DIAGNOSIS — I10 Essential (primary) hypertension: Secondary | ICD-10-CM | POA: Diagnosis not present

## 2016-11-13 DIAGNOSIS — E1165 Type 2 diabetes mellitus with hyperglycemia: Secondary | ICD-10-CM | POA: Diagnosis not present

## 2016-11-13 DIAGNOSIS — E7800 Pure hypercholesterolemia, unspecified: Secondary | ICD-10-CM | POA: Diagnosis not present

## 2016-11-21 DIAGNOSIS — E1122 Type 2 diabetes mellitus with diabetic chronic kidney disease: Secondary | ICD-10-CM | POA: Diagnosis not present

## 2016-11-21 DIAGNOSIS — I10 Essential (primary) hypertension: Secondary | ICD-10-CM | POA: Diagnosis not present

## 2016-12-18 DIAGNOSIS — E1122 Type 2 diabetes mellitus with diabetic chronic kidney disease: Secondary | ICD-10-CM | POA: Diagnosis not present

## 2016-12-18 DIAGNOSIS — I10 Essential (primary) hypertension: Secondary | ICD-10-CM | POA: Diagnosis not present

## 2016-12-25 DIAGNOSIS — E7800 Pure hypercholesterolemia, unspecified: Secondary | ICD-10-CM | POA: Diagnosis not present

## 2016-12-25 DIAGNOSIS — E1165 Type 2 diabetes mellitus with hyperglycemia: Secondary | ICD-10-CM | POA: Diagnosis not present

## 2016-12-25 DIAGNOSIS — E119 Type 2 diabetes mellitus without complications: Secondary | ICD-10-CM | POA: Diagnosis not present

## 2016-12-25 DIAGNOSIS — I10 Essential (primary) hypertension: Secondary | ICD-10-CM | POA: Diagnosis not present

## 2017-01-17 DIAGNOSIS — E1122 Type 2 diabetes mellitus with diabetic chronic kidney disease: Secondary | ICD-10-CM | POA: Diagnosis not present

## 2017-01-17 DIAGNOSIS — I10 Essential (primary) hypertension: Secondary | ICD-10-CM | POA: Diagnosis not present

## 2017-01-31 ENCOUNTER — Observation Stay (HOSPITAL_COMMUNITY)
Admission: EM | Admit: 2017-01-31 | Discharge: 2017-02-02 | Disposition: A | Discharge status: Home / Self Care | Payer: Medicare Other | Attending: Internal Medicine | Admitting: Internal Medicine

## 2017-01-31 ENCOUNTER — Emergency Department (HOSPITAL_COMMUNITY): Payer: Medicare Other

## 2017-01-31 ENCOUNTER — Encounter (HOSPITAL_COMMUNITY): Payer: Self-pay

## 2017-01-31 DIAGNOSIS — T447X1A Poisoning by beta-adrenoreceptor antagonists, accidental (unintentional), initial encounter: Principal | ICD-10-CM | POA: Insufficient documentation

## 2017-01-31 DIAGNOSIS — I442 Atrioventricular block, complete: Secondary | ICD-10-CM | POA: Diagnosis not present

## 2017-01-31 DIAGNOSIS — E875 Hyperkalemia: Secondary | ICD-10-CM | POA: Insufficient documentation

## 2017-01-31 DIAGNOSIS — I129 Hypertensive chronic kidney disease with stage 1 through stage 4 chronic kidney disease, or unspecified chronic kidney disease: Secondary | ICD-10-CM | POA: Diagnosis not present

## 2017-01-31 DIAGNOSIS — E1122 Type 2 diabetes mellitus with diabetic chronic kidney disease: Secondary | ICD-10-CM | POA: Insufficient documentation

## 2017-01-31 DIAGNOSIS — Z880 Allergy status to penicillin: Secondary | ICD-10-CM | POA: Insufficient documentation

## 2017-01-31 DIAGNOSIS — M1990 Unspecified osteoarthritis, unspecified site: Secondary | ICD-10-CM | POA: Diagnosis not present

## 2017-01-31 DIAGNOSIS — D631 Anemia in chronic kidney disease: Secondary | ICD-10-CM | POA: Diagnosis not present

## 2017-01-31 DIAGNOSIS — Z91040 Latex allergy status: Secondary | ICD-10-CM | POA: Insufficient documentation

## 2017-01-31 DIAGNOSIS — Z888 Allergy status to other drugs, medicaments and biological substances status: Secondary | ICD-10-CM | POA: Insufficient documentation

## 2017-01-31 DIAGNOSIS — G44209 Tension-type headache, unspecified, not intractable: Secondary | ICD-10-CM | POA: Diagnosis not present

## 2017-01-31 DIAGNOSIS — R404 Transient alteration of awareness: Secondary | ICD-10-CM | POA: Diagnosis not present

## 2017-01-31 DIAGNOSIS — Z66 Do not resuscitate: Secondary | ICD-10-CM | POA: Diagnosis not present

## 2017-01-31 DIAGNOSIS — Z91041 Radiographic dye allergy status: Secondary | ICD-10-CM | POA: Insufficient documentation

## 2017-01-31 DIAGNOSIS — E1165 Type 2 diabetes mellitus with hyperglycemia: Secondary | ICD-10-CM | POA: Insufficient documentation

## 2017-01-31 DIAGNOSIS — Z881 Allergy status to other antibiotic agents status: Secondary | ICD-10-CM | POA: Insufficient documentation

## 2017-01-31 DIAGNOSIS — E118 Type 2 diabetes mellitus with unspecified complications: Secondary | ICD-10-CM

## 2017-01-31 DIAGNOSIS — R001 Bradycardia, unspecified: Secondary | ICD-10-CM | POA: Diagnosis not present

## 2017-01-31 DIAGNOSIS — F0390 Unspecified dementia without behavioral disturbance: Secondary | ICD-10-CM | POA: Insufficient documentation

## 2017-01-31 DIAGNOSIS — E876 Hypokalemia: Secondary | ICD-10-CM

## 2017-01-31 DIAGNOSIS — T461X1A Poisoning by calcium-channel blockers, accidental (unintentional), initial encounter: Secondary | ICD-10-CM | POA: Diagnosis not present

## 2017-01-31 DIAGNOSIS — N183 Chronic kidney disease, stage 3 unspecified: Secondary | ICD-10-CM

## 2017-01-31 DIAGNOSIS — R531 Weakness: Secondary | ICD-10-CM | POA: Diagnosis not present

## 2017-01-31 DIAGNOSIS — Z7982 Long term (current) use of aspirin: Secondary | ICD-10-CM | POA: Insufficient documentation

## 2017-01-31 DIAGNOSIS — D649 Anemia, unspecified: Secondary | ICD-10-CM | POA: Diagnosis present

## 2017-01-31 DIAGNOSIS — R51 Headache: Secondary | ICD-10-CM | POA: Insufficient documentation

## 2017-01-31 DIAGNOSIS — F419 Anxiety disorder, unspecified: Secondary | ICD-10-CM | POA: Insufficient documentation

## 2017-01-31 DIAGNOSIS — K219 Gastro-esophageal reflux disease without esophagitis: Secondary | ICD-10-CM | POA: Diagnosis not present

## 2017-01-31 DIAGNOSIS — E1142 Type 2 diabetes mellitus with diabetic polyneuropathy: Secondary | ICD-10-CM | POA: Diagnosis not present

## 2017-01-31 DIAGNOSIS — I480 Paroxysmal atrial fibrillation: Secondary | ICD-10-CM | POA: Insufficient documentation

## 2017-01-31 DIAGNOSIS — R109 Unspecified abdominal pain: Secondary | ICD-10-CM | POA: Diagnosis not present

## 2017-01-31 DIAGNOSIS — E785 Hyperlipidemia, unspecified: Secondary | ICD-10-CM | POA: Diagnosis not present

## 2017-01-31 DIAGNOSIS — E119 Type 2 diabetes mellitus without complications: Secondary | ICD-10-CM

## 2017-01-31 DIAGNOSIS — T50901A Poisoning by unspecified drugs, medicaments and biological substances, accidental (unintentional), initial encounter: Secondary | ICD-10-CM

## 2017-01-31 DIAGNOSIS — I10 Essential (primary) hypertension: Secondary | ICD-10-CM | POA: Diagnosis present

## 2017-01-31 DIAGNOSIS — Z794 Long term (current) use of insulin: Secondary | ICD-10-CM | POA: Insufficient documentation

## 2017-01-31 DIAGNOSIS — I4891 Unspecified atrial fibrillation: Secondary | ICD-10-CM | POA: Diagnosis present

## 2017-01-31 DIAGNOSIS — Z87891 Personal history of nicotine dependence: Secondary | ICD-10-CM | POA: Insufficient documentation

## 2017-01-31 LAB — I-STAT CHEM 8, ED
BUN: 38 mg/dL — AB (ref 6–20)
CREATININE: 2.8 mg/dL — AB (ref 0.44–1.00)
Calcium, Ion: 1.1 mmol/L — ABNORMAL LOW (ref 1.15–1.40)
Chloride: 104 mmol/L (ref 101–111)
GLUCOSE: 326 mg/dL — AB (ref 65–99)
HCT: 25 % — ABNORMAL LOW (ref 36.0–46.0)
HEMOGLOBIN: 8.5 g/dL — AB (ref 12.0–15.0)
Potassium: 6.6 mmol/L (ref 3.5–5.1)
Sodium: 134 mmol/L — ABNORMAL LOW (ref 135–145)
TCO2: 24 mmol/L (ref 0–100)

## 2017-01-31 LAB — CBC WITH DIFFERENTIAL/PLATELET
BASOS ABS: 0 10*3/uL (ref 0.0–0.1)
BASOS PCT: 0 %
EOS PCT: 0 %
Eosinophils Absolute: 0 10*3/uL (ref 0.0–0.7)
HEMATOCRIT: 28.1 % — AB (ref 36.0–46.0)
Hemoglobin: 9 g/dL — ABNORMAL LOW (ref 12.0–15.0)
LYMPHS PCT: 27 %
Lymphs Abs: 1.2 10*3/uL (ref 0.7–4.0)
MCH: 30.3 pg (ref 26.0–34.0)
MCHC: 32 g/dL (ref 30.0–36.0)
MCV: 94.6 fL (ref 78.0–100.0)
MONO ABS: 0.1 10*3/uL (ref 0.1–1.0)
MONOS PCT: 3 %
NEUTROS ABS: 3.2 10*3/uL (ref 1.7–7.7)
Neutrophils Relative %: 70 %
PLATELETS: 158 10*3/uL (ref 150–400)
RBC: 2.97 MIL/uL — ABNORMAL LOW (ref 3.87–5.11)
RDW: 12.6 % (ref 11.5–15.5)
WBC: 4.6 10*3/uL (ref 4.0–10.5)

## 2017-01-31 LAB — BASIC METABOLIC PANEL
Anion gap: 9 (ref 5–15)
BUN: 34 mg/dL — AB (ref 6–20)
CALCIUM: 9 mg/dL (ref 8.9–10.3)
CHLORIDE: 103 mmol/L (ref 101–111)
CO2: 22 mmol/L (ref 22–32)
CREATININE: 2.75 mg/dL — AB (ref 0.44–1.00)
GFR calc Af Amer: 17 mL/min — ABNORMAL LOW (ref >60)
GFR calc non Af Amer: 15 mL/min — ABNORMAL LOW (ref >60)
Glucose, Bld: 333 mg/dL — ABNORMAL HIGH (ref 65–99)
Potassium: 6.8 mmol/L (ref 3.5–5.1)
Sodium: 134 mmol/L — ABNORMAL LOW (ref 135–145)

## 2017-01-31 LAB — BRAIN NATRIURETIC PEPTIDE: B Natriuretic Peptide: 147 pg/mL — ABNORMAL HIGH (ref 0.0–100.0)

## 2017-01-31 LAB — I-STAT TROPONIN, ED: TROPONIN I, POC: 0 ng/mL (ref 0.00–0.08)

## 2017-01-31 MED ORDER — GLUCAGON HCL RDNA (DIAGNOSTIC) 1 MG IJ SOLR
3.0000 mg | Freq: Once | INTRAMUSCULAR | Status: AC
  Administered 2017-01-31: 3 mg via INTRAVENOUS
  Filled 2017-01-31: qty 3

## 2017-01-31 MED ORDER — ONDANSETRON HCL 4 MG/2ML IJ SOLN
4.0000 mg | Freq: Once | INTRAMUSCULAR | Status: AC
  Administered 2017-01-31: 4 mg via INTRAVENOUS
  Filled 2017-01-31: qty 2

## 2017-01-31 MED ORDER — INSULIN ASPART 100 UNIT/ML ~~LOC~~ SOLN
5.0000 [IU] | Freq: Once | SUBCUTANEOUS | Status: AC
  Administered 2017-01-31: 5 [IU] via INTRAVENOUS
  Filled 2017-01-31: qty 1

## 2017-01-31 MED ORDER — SODIUM CHLORIDE 0.9 % IV BOLUS (SEPSIS)
1000.0000 mL | Freq: Once | INTRAVENOUS | Status: AC
  Administered 2017-01-31: 1000 mL via INTRAVENOUS

## 2017-01-31 MED ORDER — SODIUM CHLORIDE 0.9 % IV SOLN
1.0000 g | Freq: Once | INTRAVENOUS | Status: AC
  Administered 2017-01-31: 1 g via INTRAVENOUS
  Filled 2017-01-31: qty 10

## 2017-01-31 MED ORDER — GLUCAGON HCL RDNA (DIAGNOSTIC) 1 MG IJ SOLR
1.0000 mg | Freq: Once | INTRAMUSCULAR | Status: AC
  Administered 2017-01-31: 1 mg via INTRAVENOUS
  Filled 2017-01-31: qty 1

## 2017-01-31 MED ORDER — DEXTROSE 50 % IV SOLN
1.0000 | Freq: Once | INTRAVENOUS | Status: AC
  Administered 2017-01-31: 50 mL via INTRAVENOUS
  Filled 2017-01-31: qty 50

## 2017-01-31 MED ORDER — ALBUTEROL SULFATE (2.5 MG/3ML) 0.083% IN NEBU
2.5000 mg | INHALATION_SOLUTION | Freq: Once | RESPIRATORY_TRACT | Status: AC
  Administered 2017-01-31: 2.5 mg via RESPIRATORY_TRACT
  Filled 2017-01-31: qty 3

## 2017-01-31 MED ORDER — SODIUM BICARBONATE 8.4 % IV SOLN
50.0000 meq | Freq: Once | INTRAVENOUS | Status: AC
  Administered 2017-01-31: 50 meq via INTRAVENOUS
  Filled 2017-01-31: qty 50

## 2017-01-31 MED ORDER — ATROPINE SULFATE 1 MG/10ML IJ SOSY
0.5000 mg | PREFILLED_SYRINGE | INTRAMUSCULAR | Status: AC
  Administered 2017-01-31: 0.5 mg via INTRAVENOUS
  Filled 2017-01-31: qty 10

## 2017-01-31 NOTE — ED Notes (Signed)
Awaiting meds from pharmacy  

## 2017-01-31 NOTE — ED Notes (Signed)
edp notified of pts hr

## 2017-01-31 NOTE — ED Provider Notes (Signed)
[facility]EMER[facility]Y DEPT Provider Note   CSN: [id number] Arrival date & time: 01/31/17  2024     History   Chief Complaint Chief Complaint  Patient presents with  . Bradycardia  . Fatigue    HPI Shari Golden is a 83 y.o. female.  HPI 83 year old female with past medical history of dementia, atrial fibrillation presents after concern of overdose Daughter states that patient is slightly confused at baseline and sometimes forgets She noted that today she was weak and vomiting and noticed that she had taken 1-2 extra doses of her long acting metoprolol and diltiazem She checked her heart rate and noted that it was in the 30s and noted that her mother appeared weak and sweaty Patient is confused and unable to provide much history, however she states she took her medications this morning She denies any chest pain or shortness of breath Patient at baseline has dementia but is typically more alert  Past Medical History:  Diagnosis Date  . Anemia   . Anxiety   . Arthritis   . Atrial fibrillation (HCC)    Remotely documented without recurrence  . Carotid artery disease (HCC)    Dr. Hart Rochester  . CKD (chronic kidney disease), stage III    Dr. Fausto Skillern  . Constipation   . Essential hypertension   . GERD (gastroesophageal reflux disease)   . History of cardiac catheterization    Normal coronaries 2004  . Peripheral neuropathy (HCC)   . Type II diabetes mellitus [facility])     Patient Active Problem List   Diagnosis Date Noted  . Bradycardia 01/31/2017  . Burning sensation-right arm/wrist 11/17/2013  . Weakness of wrist-Right, and arm 11/17/2013  . Occlusion and stenosis of carotid artery without mention of cerebral infarction 11/04/2012  . Diabetes mellitus 10/24/2011  . Hypertension   . Chronic kidney disease   . GERD (gastroesophageal reflux disease)   . Peripheral neuropathy (HCC)   . Chest tightness   . Atrial fibrillation (HCC)   . Ejection fraction   . Mitral regurgitation    . Atrial septal aneurysm   . Edema   . Anxiety   . Carotid artery disease [facility])     Past Surgical History:  Procedure Laterality Date  . ABDOMINAL HYSTERECTOMY    . APPENDECTOMY    . CARPAL TUNNEL RELEASE Bilateral   . LUMBAR LAMINECTOMY  ~ 2011  . SHOULDER ARTHROSCOPY W/ ROTATOR CUFF REPAIR Left   . TONSILLECTOMY  2012  . WRIST FUSION Right 04/01/2013   Procedure: RIGHT WRIST ARTHRODESIS WITH POSSIBLE LOCAL BONE GRAFT;  Surgeon: Sharma Covert, MD;  Location: [facility] OR;  Service: Orthopedics;  Laterality: Right;    OB History    No data available       Home Medications    Prior to Admission medications   Medication Sig Start Date End Date Taking? Authorizing Provider  acetaminophen (TYLENOL) 325 MG tablet Take 650 mg by mouth every 6 (six) hours as needed.   Yes Historical Provider, MD  aspirin 81 MG chewable tablet Chew 81 mg by mouth daily.   Yes Historical Provider, MD  diltiazem (CARDIZEM CD) 360 MG 24 hr capsule Take 360 mg by mouth daily.   Yes Historical Provider, MD  furosemide (LASIX) 40 MG tablet Take 20 mg by mouth every other day.    Yes Historical Provider, MD  insulin glargine (LANTUS) 100 UNIT/ML injection Inject 5-8 Units into the skin 4 (four) times daily as needed (sugar level - sliding scale).  Sliding scale   Yes Historical Provider, MD  Iron-Vitamin C (VITRON-C) 65-125 MG TABS Take 1 tablet by mouth daily.   Yes Historical Provider, MD  metoprolol succinate (TOPROL-XL) 25 MG 24 hr tablet Take 25 mg by mouth daily.   Yes Historical Provider, MD  multivitamin (RENA-VIT) TABS tablet Take 1 tablet by mouth daily.     Yes Historical Provider, MD  pantoprazole (PROTONIX) 40 MG tablet Take 40 mg by mouth daily.     Yes Historical Provider, MD  simvastatin (ZOCOR) 20 MG tablet Take 20 mg by mouth daily.    Yes Historical Provider, MD    Family History Family History  Problem Relation Age of Onset  . Diabetes Sister   . Heart disease Sister   . Prostate cancer  Father   . Stroke Neg Hx   . Coronary artery disease Neg Hx     Social History Social History  Substance Use Topics  . Smoking status: Never Smoker  . Smokeless tobacco: Former NeurosurgeonUser    Types: Snuff     Comment: 04/01/2013 "smoked 3 cigarettes in 1956; stopped; quit dipping > 20 yr ago"  . Alcohol use No     Allergies   Erythromycin; Iodine; Iohexol; Latex; and Penicillins   Review of Systems Review of Systems  Unable to perform ROS: Mental status change     Physical Exam Updated Vital Signs BP (!) 89/75   Pulse (!) 34   Temp 98.5 F (36.9 C) (Oral)   Resp 15   Ht 5\' 5"  (1.651 m)   Wt 61.2 kg   SpO2 98%   BMI 22.47 kg/m   Physical Exam  Constitutional: She appears well-developed and well-nourished. No distress.  HENT:  Head: Normocephalic and atraumatic.  Eyes: Conjunctivae are normal.  Neck: Neck supple.  Cardiovascular: Regular rhythm.   No murmur heard. Bradycardic to 30s  Pulmonary/Chest: Effort normal and breath sounds normal. No respiratory distress.  Abdominal: Soft. There is no tenderness.  Musculoskeletal: She exhibits no edema or deformity.  Neurological: She is alert.  Skin: Skin is warm and dry. Capillary refill takes less than 2 seconds.  Nursing note and vitals reviewed.    ED Treatments / Results  Labs (all labs ordered are listed, but only abnormal results are displayed) Labs Reviewed  CBC WITH DIFFERENTIAL/PLATELET - Abnormal; Notable for the following:       Result Value   RBC 2.97 (*)    Hemoglobin 9.0 (*)    HCT 28.1 (*)    All other components within normal limits  BRAIN NATRIURETIC PEPTIDE - Abnormal; Notable for the following:    B Natriuretic Peptide 147.0 (*)    All other components within normal limits  BASIC METABOLIC PANEL - Abnormal; Notable for the following:    Sodium 134 (*)    Potassium 6.8 (*)    Glucose, Bld 333 (*)    BUN 34 (*)    Creatinine, Ser 2.75 (*)    GFR calc non Af Amer 15 (*)    GFR calc Af Amer  17 (*)    All other components within normal limits  I-STAT CHEM 8, ED - Abnormal; Notable for the following:    Sodium 134 (*)    Potassium 6.6 (*)    BUN 38 (*)    Creatinine, Ser 2.80 (*)    Glucose, Bld 326 (*)    Calcium, Ion 1.10 (*)    Hemoglobin 8.5 (*)    HCT 25.0 (*)  All other components within normal limits  I-STAT TROPOININ, ED    EKG  EKG Interpretation None       Radiology Dg Chest Portable 1 View  Result Date: 01/31/2017 CLINICAL DATA:  Acute onset of generalized weakness and fatigue. Bradycardia. Initial encounter. EXAM: PORTABLE CHEST 1 VIEW COMPARISON:  Chest radiograph performed 03/26/2013 FINDINGS: The lungs are well-aerated. Mild retrocardiac opacity may reflect atelectasis or possibly mild infection. There is no evidence of pleural effusion or pneumothorax. The cardiomediastinal silhouette is borderline normal in size. No acute osseous abnormalities are seen. External pacing pads are noted. IMPRESSION: Mild retrocardiac opacity may reflect atelectasis or possibly mild infection. Electronically Signed   By: Roanna Raider M.D.   On: 01/31/2017 21:43    Procedures Procedures (including critical care time)  Medications Ordered in ED Medications  atropine 1 MG/10ML injection 0.5 mg (0 mg Intravenous Hold 01/31/17 2120)  albuterol (PROVENTIL) (2.5 MG/3ML) 0.083% nebulizer solution 2.5 mg (not administered)  glucagon (human recombinant) (GLUCAGEN) injection 1 mg (1 mg Intravenous Given 01/31/17 2119)  calcium gluconate 1 g in sodium chloride 0.9 % 100 mL IVPB (0 g Intravenous Stopped 01/31/17 2210)  glucagon (human recombinant) (GLUCAGEN) injection 3 mg (3 mg Intravenous Given 01/31/17 2222)  ondansetron (ZOFRAN) injection 4 mg (4 mg Intravenous Given 01/31/17 2222)  insulin aspart (novoLOG) injection 5 Units (5 Units Intravenous Given 01/31/17 2248)  dextrose 50 % solution 50 mL (50 mLs Intravenous Given 01/31/17 2249)  sodium bicarbonate injection 50 mEq (50 mEq  Intravenous Given 01/31/17 2249)  sodium chloride 0.9 % bolus 1,000 mL (1,000 mLs Intravenous New Bag/Given 01/31/17 2249)  ondansetron (ZOFRAN) injection 4 mg (4 mg Intravenous Given 01/31/17 2249)     Initial Impression / Assessment and Plan / ED Course  I have reviewed the triage vital signs and the nursing notes.  Pertinent labs & imaging results that were available during my care of the patient were reviewed by me and considered in my medical decision making (see chart for details).     Patient has symptomatically bradycardia with hypotension on arrival secondary to overdose of calcium channel blocker and beta blocker Atropine given and patient had improvement in her heart rate from 30 to 50s as well as blood pressure Patient appears to be perfusing her brain better and does not require intubation Labs obtained and show an acute kidney injury, likely secondary to low perfusion from overdose Fluids administered, potassium is elevated and she has received insulin, D50, bicarbonate and fluids as well as Calcium I suspect the hyperkalemia is not driving the bradycardia but is the reverse from an AKI She has no evidence of ACS on EKG or troponin Spoke with poison center who recommended glucagon and calcium, had some vomiting from glucagon but not much improvement Prn atropine given, bp is now more stable Will admit  Final Clinical Impressions(s) / ED Diagnoses   Final diagnoses:  Accidental overdose, initial encounter  Bradycardia  Hypokalemia    New Prescriptions New Prescriptions   No medications on file     Sidney Ace, MD 01/31/17 2250    Derwood Kaplan, MD 02/01/17 0105

## 2017-01-31 NOTE — ED Notes (Signed)
Family at bedside. 

## 2017-01-31 NOTE — H&P (Signed)
History and Physical    Shari Golden Shari Golden DOB: December 16, 1929 DOA: 01/31/2017  PCP: Donzetta Sprung, MD   Patient coming from: Home.  Chief Complaint: Weakness and fatigue.  HPI: Shari Golden is a 83 y.o. female with medical history significant of anemia, anxiety, osteoarthritis, paroxysmal atrial fibrillation, carotid artery disease, chronic kidney disease stage III, constipation, essential hypertension, GERD, type 2 diabetes with peripheral neuropathy who was brought to the emergency department via EMS complaining of generalized weakness and fatigue for the past 5 hours. The patient also stated that she had a headache and abdominal pain earlier. Initial EMS evaluation shows a heart rate in the 30s. The patient believes that she took an dose of extra metoprolol and Cardizem by mistake, thinking that she had not taken it earlier. She is awake, alert, oriented 3, but has a history of mild dementia.  ED Course: Was given atropine 2 mg IVP for bradycardia, glucagon for beta blocker reversal, albuterol, calcium and insulin for hyperkalemia/hyperglycemia. Poison control was called by the ED.   Her EKG showed junctional rhythm at 35 BPM, in complete heart block and LVH. First troponin level was normal and BNP was 447 pg/mL. Her WBC 4.6, hemoglobin 9, platelets 158. Her sodium 134, potassium 6.8, chloride 103 and bicarbonate 22 mmol/L. Her BUN was 34, creatinine 2.75 and glucose 333 mg/dL. Her previous creatinine from 6 or 7 years ago was 1.23. A chest radiograph show mild retrocardiac atelectasis or possibly infection.  Review of Systems: As per HPI otherwise 10 point review of systems negative.    Past Medical History:  Diagnosis Date  . Anemia   . Anxiety   . Arthritis   . Atrial fibrillation (HCC)    Remotely documented without recurrence  . Carotid artery disease (HCC)    Dr. Hart Rochester  . CKD (chronic kidney disease), stage III    Dr. Fausto Skillern  . Constipation   . Essential  hypertension   . GERD (gastroesophageal reflux disease)   . History of cardiac catheterization    Normal coronaries 2004  . Peripheral neuropathy (HCC)   . Type II diabetes mellitus (HCC)     Past Surgical History:  Procedure Laterality Date  . ABDOMINAL HYSTERECTOMY    . APPENDECTOMY    . CARPAL TUNNEL RELEASE Bilateral   . LUMBAR LAMINECTOMY  ~ 2011  . SHOULDER ARTHROSCOPY W/ ROTATOR CUFF REPAIR Left   . TONSILLECTOMY  2012  . WRIST FUSION Right 04/01/2013   Procedure: RIGHT WRIST ARTHRODESIS WITH POSSIBLE LOCAL BONE GRAFT;  Surgeon: Sharma Covert, MD;  Location: [facility] OR;  Service: Orthopedics;  Laterality: Right;     reports that she has never smoked. She has quit using smokeless tobacco. Her smokeless tobacco use included Snuff. She reports that she does not drink alcohol or use drugs.  Allergies  Allergen Reactions  . Erythromycin Other (See Comments)    REACTION: Dehydration  . Iodine Itching, Rash and Other (See Comments)    REACTION: dizzy  . Iohexol Other (See Comments)     Code: HIVES, Desc: IODINE BASED CONTRAST   . Latex Swelling and Other (See Comments)    REACTION: Swelling (If touched on face will have facial swelling)  . Penicillins Itching and Rash    Family History  Problem Relation Age of Onset  . Diabetes Sister   . Heart disease Sister   . Prostate cancer Father   . Stroke Neg Hx   . Coronary artery disease Neg Hx  Prior to Admission medications   Medication Sig Start Date End Date Taking? Authorizing Provider  acetaminophen (TYLENOL) 325 MG tablet Take 650 mg by mouth every 6 (six) hours as needed.   Yes Historical Provider, MD  aspirin 81 MG chewable tablet Chew 81 mg by mouth daily.   Yes Historical Provider, MD  diltiazem (CARDIZEM CD) 360 MG 24 hr capsule Take 360 mg by mouth daily.   Yes Historical Provider, MD  furosemide (LASIX) 40 MG tablet Take 20 mg by mouth every other day.    Yes Historical Provider, MD  insulin glargine (LANTUS)  100 UNIT/ML injection Inject 5-8 Units into the skin 4 (four) times daily as needed (sugar level - sliding scale). Sliding scale   Yes Historical Provider, MD  Iron-Vitamin C (VITRON-C) 65-125 MG TABS Take 1 tablet by mouth daily.   Yes Historical Provider, MD  metoprolol succinate (TOPROL-XL) 25 MG 24 hr tablet Take 25 mg by mouth daily.   Yes Historical Provider, MD  multivitamin (RENA-VIT) TABS tablet Take 1 tablet by mouth daily.     Yes Historical Provider, MD  pantoprazole (PROTONIX) 40 MG tablet Take 40 mg by mouth daily.     Yes Historical Provider, MD  simvastatin (ZOCOR) 20 MG tablet Take 20 mg by mouth daily.    Yes Historical Provider, MD    Physical Exam:  Constitutional: NAD, calm, comfortable Vitals:   01/31/17 2200 01/31/17 2215 01/31/17 2230 01/31/17 2245  BP: (!) 117/51 (!) 117/51 154/57 (!) 119/54  Pulse: (!) 43 (!) 40 (!) 49 (!) 43  Resp: 16 13 20 19   Temp:      TempSrc:      SpO2: 96% 99% (!) 89% 96%  Weight:      Height:       Eyes: PERRL, lids and conjunctivae normal ENMT: Mucous membranes are moist. Posterior pharynx clear of any exudate or lesions. Neck: normal, supple, no masses, no thyromegaly Respiratory:  Decreased breath sounds on bases, no wheezing, no crackles. Normal respiratory effort. No accessory muscle use.  Cardiovascular: Bradycardic earlier in the 30s to 50s, Regular rate and rhythm at 72 BPM post albuterol neb, positive systolic murmur, no rubs / gallops. No extremity edema. 2+ pedal pulses. No carotid bruits.  Abdomen: Soft, no tenderness, no masses palpated. No hepatosplenomegaly. Bowel sounds positive.  Musculoskeletal: no clubbing / cyanosis. Good ROM, no contractures. Normal muscle tone.  Skin: no rashes, lesions, ulcers on limited skin exam.  Neurologic: CN 2-12 grossly intact. Sensation intact, DTR normal. Strength 5/5 in all 4.  Psychiatric: Alert and oriented x 3.    Labs on Admission: I have personally reviewed following labs and  imaging studies  CBC:  Recent Labs Lab 01/31/17 2117 01/31/17 2124  WBC 4.6  --   NEUTROABS 3.2  --   HGB 9.0* 8.5*  HCT 28.1* 25.0*  MCV 94.6  --   PLT 158  --    Basic Metabolic Panel:  Recent Labs Lab 01/31/17 2124 01/31/17 2129  NA 134* 134*  K 6.6* 6.8*  CL 104 103  CO2  --  22  GLUCOSE 326* 333*  BUN 38* 34*  CREATININE 2.80* 2.75*  CALCIUM  --  9.0   GFR: Estimated Creatinine Clearance: 13.5 mL/min (by C-G formula based on SCr of 2.75 mg/dL (H)). Liver Function Tests: No results for input(s): AST, ALT, ALKPHOS, BILITOT, PROT, ALBUMIN in the last 168 hours. No results for input(s): LIPASE, AMYLASE in the last 168 hours. No  results for input(s): AMMONIA in the last 168 hours. Coagulation Profile: No results for input(s): INR, PROTIME in the last 168 hours. Cardiac Enzymes: No results for input(s): CKTOTAL, CKMB, CKMBINDEX, TROPONINI in the last 168 hours. BNP (last 3 results) No results for input(s): PROBNP in the last 8760 hours. HbA1C: No results for input(s): HGBA1C in the last 72 hours. CBG: No results for input(s): GLUCAP in the last 168 hours. Lipid Profile: No results for input(s): CHOL, HDL, LDLCALC, TRIG, CHOLHDL, LDLDIRECT in the last 72 hours. Thyroid Function Tests: No results for input(s): TSH, T4TOTAL, FREET4, T3FREE, THYROIDAB in the last 72 hours. Anemia Panel: No results for input(s): VITAMINB12, FOLATE, FERRITIN, TIBC, IRON, RETICCTPCT in the last 72 hours. Urine analysis: No results found for: COLORURINE, APPEARANCEUR, LABSPEC, PHURINE, GLUCOSEU, HGBUR, BILIRUBINUR, KETONESUR, PROTEINUR, UROBILINOGEN, NITRITE, LEUKOCYTESUR  Radiological Exams on Admission: Dg Chest Portable 1 View  Result Date: 01/31/2017 CLINICAL DATA:  Acute onset of generalized weakness and fatigue. Bradycardia. Initial encounter. EXAM: PORTABLE CHEST 1 VIEW COMPARISON:  Chest radiograph performed 03/26/2013 FINDINGS: The lungs are well-aerated. Mild retrocardiac  opacity may reflect atelectasis or possibly mild infection. There is no evidence of pleural effusion or pneumothorax. The cardiomediastinal silhouette is borderline normal in size. No acute osseous abnormalities are seen. External pacing pads are noted. IMPRESSION: Mild retrocardiac opacity may reflect atelectasis or possibly mild infection. Electronically Signed   By: Roanna Raider M.D.   On: 01/31/2017 21:43    EKG: Independently reviewed. Vent. rate 35 BPM PR interval * ms QRS duration 112 ms QT/QTc 474/362 ms P-R-T axes * -43 84 Junctional rhythm Incomplete left bundle branch block Left ventricular hypertrophy  Assessment/Plan Principal Problem:   Bradycardia Admit to stepdown. Beta and calcium channel blocker have been held. Continue close monitoring of heart rate. Gentle and time-limited IV hydration. Atropine as needed.  Active Problems:   Atrial fibrillation (HCC) Remotely documented without recurrence. CHA2DS2-VASc Score of at least 6. Hold calcium and beta blocker. Continue cardiac monitoring. Not on anticoagulation. The patient is only taking aspirin 81 mg by mouth daily    Type II diabetes mellitus (HCC) Carbohydrate modified diet.. CBG monitoring Regular Insulin sliding scale.    Essential hypertension Hold Cardizem, metoprolol and furosemide. Monitor blood pressure.    Anemia Monitor hematocrit and hemoglobin. Continue iron and vitamin C supplementation.    Hyperlipidemia Continue simvastatin 20 mg by mouth daily.    Hyperkalemia Calcium gluconate, insulin and albuterol given. Follow-up potassium level.     DVT prophylaxis: Lovenox SQ. Code Status: DO NOT RESUSCITATE. Family Communication: Her granddaughter was present in the emergency department. Disposition Plan: Admit for cardiac monitoring and hyperkalemia treatment. Consults called: As uncontrolled was called by the emergency department. Admission status: Inpatient/stepdown.   Bobette Mo MD [facility] Pager [phone].  If 7PM-7AM, please contact night-coverage [email or web address] Password [facility]  01/31/2017, 11:14 PM

## 2017-01-31 NOTE — ED Triage Notes (Signed)
Pt presents to the ed with ems with complaints of generalized weakness and fatigue x 5 hours, she had a headache and abdominal pain earlier today that has subsided, her heart rate has been in the 30's with EMS, it appears that she took one too many metoprolol today, she is has dementia and is at her baseline, alert and oriented to person, place and time.

## 2017-02-01 DIAGNOSIS — T447X1A Poisoning by beta-adrenoreceptor antagonists, accidental (unintentional), initial encounter: Secondary | ICD-10-CM | POA: Diagnosis not present

## 2017-02-01 DIAGNOSIS — T50901A Poisoning by unspecified drugs, medicaments and biological substances, accidental (unintentional), initial encounter: Secondary | ICD-10-CM

## 2017-02-01 DIAGNOSIS — E875 Hyperkalemia: Secondary | ICD-10-CM | POA: Diagnosis not present

## 2017-02-01 DIAGNOSIS — E1142 Type 2 diabetes mellitus with diabetic polyneuropathy: Secondary | ICD-10-CM | POA: Diagnosis not present

## 2017-02-01 DIAGNOSIS — R109 Unspecified abdominal pain: Secondary | ICD-10-CM | POA: Diagnosis not present

## 2017-02-01 DIAGNOSIS — F0390 Unspecified dementia without behavioral disturbance: Secondary | ICD-10-CM | POA: Diagnosis not present

## 2017-02-01 DIAGNOSIS — R51 Headache: Secondary | ICD-10-CM | POA: Diagnosis not present

## 2017-02-01 DIAGNOSIS — D631 Anemia in chronic kidney disease: Secondary | ICD-10-CM | POA: Diagnosis not present

## 2017-02-01 DIAGNOSIS — I480 Paroxysmal atrial fibrillation: Secondary | ICD-10-CM | POA: Diagnosis not present

## 2017-02-01 DIAGNOSIS — E118 Type 2 diabetes mellitus with unspecified complications: Secondary | ICD-10-CM | POA: Diagnosis not present

## 2017-02-01 DIAGNOSIS — I10 Essential (primary) hypertension: Secondary | ICD-10-CM

## 2017-02-01 DIAGNOSIS — E784 Other hyperlipidemia: Secondary | ICD-10-CM | POA: Diagnosis not present

## 2017-02-01 DIAGNOSIS — E1122 Type 2 diabetes mellitus with diabetic chronic kidney disease: Secondary | ICD-10-CM | POA: Diagnosis not present

## 2017-02-01 DIAGNOSIS — Z66 Do not resuscitate: Secondary | ICD-10-CM | POA: Diagnosis not present

## 2017-02-01 DIAGNOSIS — N183 Chronic kidney disease, stage 3 (moderate): Secondary | ICD-10-CM | POA: Diagnosis not present

## 2017-02-01 DIAGNOSIS — R001 Bradycardia, unspecified: Secondary | ICD-10-CM | POA: Diagnosis not present

## 2017-02-01 DIAGNOSIS — E1165 Type 2 diabetes mellitus with hyperglycemia: Secondary | ICD-10-CM | POA: Diagnosis not present

## 2017-02-01 DIAGNOSIS — F419 Anxiety disorder, unspecified: Secondary | ICD-10-CM | POA: Diagnosis not present

## 2017-02-01 DIAGNOSIS — T461X1A Poisoning by calcium-channel blockers, accidental (unintentional), initial encounter: Secondary | ICD-10-CM | POA: Diagnosis not present

## 2017-02-01 DIAGNOSIS — K219 Gastro-esophageal reflux disease without esophagitis: Secondary | ICD-10-CM | POA: Diagnosis not present

## 2017-02-01 DIAGNOSIS — I129 Hypertensive chronic kidney disease with stage 1 through stage 4 chronic kidney disease, or unspecified chronic kidney disease: Secondary | ICD-10-CM | POA: Diagnosis not present

## 2017-02-01 DIAGNOSIS — E785 Hyperlipidemia, unspecified: Secondary | ICD-10-CM | POA: Diagnosis not present

## 2017-02-01 DIAGNOSIS — I442 Atrioventricular block, complete: Secondary | ICD-10-CM | POA: Diagnosis not present

## 2017-02-01 LAB — BASIC METABOLIC PANEL
Anion gap: 8 (ref 5–15)
BUN: 36 mg/dL — ABNORMAL HIGH (ref 6–20)
CHLORIDE: 103 mmol/L (ref 101–111)
CO2: 23 mmol/L (ref 22–32)
Calcium: 9.1 mg/dL (ref 8.9–10.3)
Creatinine, Ser: 2.46 mg/dL — ABNORMAL HIGH (ref 0.44–1.00)
GFR calc non Af Amer: 17 mL/min — ABNORMAL LOW (ref >60)
GFR, EST AFRICAN AMERICAN: 19 mL/min — AB (ref >60)
Glucose, Bld: 322 mg/dL — ABNORMAL HIGH (ref 65–99)
POTASSIUM: 5.5 mmol/L — AB (ref 3.5–5.1)
SODIUM: 134 mmol/L — AB (ref 135–145)

## 2017-02-01 LAB — CBC
HEMATOCRIT: 29.2 % — AB (ref 36.0–46.0)
Hemoglobin: 9.3 g/dL — ABNORMAL LOW (ref 12.0–15.0)
MCH: 29.7 pg (ref 26.0–34.0)
MCHC: 31.8 g/dL (ref 30.0–36.0)
MCV: 93.3 fL (ref 78.0–100.0)
PLATELETS: 182 10*3/uL (ref 150–400)
RBC: 3.13 MIL/uL — AB (ref 3.87–5.11)
RDW: 12.6 % (ref 11.5–15.5)
WBC: 6.2 10*3/uL (ref 4.0–10.5)

## 2017-02-01 LAB — GLUCOSE, CAPILLARY
GLUCOSE-CAPILLARY: 126 mg/dL — AB (ref 65–99)
Glucose-Capillary: 258 mg/dL — ABNORMAL HIGH (ref 65–99)
Glucose-Capillary: 141 mg/dL — ABNORMAL HIGH (ref 65–99)
Glucose-Capillary: 173 mg/dL — ABNORMAL HIGH (ref 65–99)

## 2017-02-01 LAB — LIPID PANEL
CHOL/HDL RATIO: 1.6 ratio
CHOLESTEROL: 116 mg/dL (ref 0–200)
HDL: 71 mg/dL (ref >40)
LDL CALC: 32 mg/dL (ref 0–99)
TRIGLYCERIDES: 65 mg/dL (ref <150)
VLDL: 13 mg/dL (ref 0–40)

## 2017-02-01 LAB — POTASSIUM: Potassium: 5 mmol/L (ref 3.5–5.1)

## 2017-02-01 LAB — MRSA PCR SCREENING: MRSA by PCR: NEGATIVE

## 2017-02-01 LAB — MAGNESIUM: MAGNESIUM: 2 mg/dL (ref 1.7–2.4)

## 2017-02-01 MED ORDER — SIMVASTATIN 20 MG PO TABS
20.0000 mg | ORAL_TABLET | Freq: Every day | ORAL | Status: DC
  Administered 2017-02-01 – 2017-02-02 (×2): 20 mg via ORAL
  Filled 2017-02-01 (×2): qty 1

## 2017-02-01 MED ORDER — ACETAMINOPHEN 325 MG PO TABS: 650.0000 mg | ORAL_TABLET | Freq: Four times a day (QID) | ORAL | Status: DC | PRN

## 2017-02-01 MED ORDER — SODIUM CHLORIDE 0.9 % IV SOLN: INTRAVENOUS | Status: AC

## 2017-02-01 MED ORDER — METOPROLOL TARTRATE 12.5 MG HALF TABLET
12.5000 mg | ORAL_TABLET | Freq: Two times a day (BID) | ORAL | Status: DC
  Administered 2017-02-01 – 2017-02-02 (×2): 12.5 mg via ORAL
  Filled 2017-02-01 (×2): qty 1

## 2017-02-01 MED ORDER — PANTOPRAZOLE SODIUM 40 MG PO TBEC
40.0000 mg | DELAYED_RELEASE_TABLET | Freq: Every day | ORAL | Status: DC
  Administered 2017-02-01 – 2017-02-02 (×2): 40 mg via ORAL
  Filled 2017-02-01 (×2): qty 1

## 2017-02-01 MED ORDER — SODIUM CHLORIDE 0.9 % IV SOLN
INTRAVENOUS | Status: DC
  Administered 2017-02-01: 01:00:00 via INTRAVENOUS

## 2017-02-01 MED ORDER — ENOXAPARIN SODIUM 30 MG/0.3ML ~~LOC~~ SOLN
30.0000 mg | Freq: Every day | SUBCUTANEOUS | Status: DC
  Administered 2017-02-01 (×2): 30 mg via SUBCUTANEOUS
  Filled 2017-02-01: qty 0.3

## 2017-02-01 MED ORDER — SODIUM POLYSTYRENE SULFONATE 15 GM/60ML PO SUSP
30.0000 g | Freq: Once | ORAL | Status: AC
  Administered 2017-02-01: 30 g via ORAL
  Filled 2017-02-01: qty 120

## 2017-02-01 MED ORDER — INSULIN ASPART 100 UNIT/ML ~~LOC~~ SOLN
0.0000 [IU] | Freq: Three times a day (TID) | SUBCUTANEOUS | Status: DC
Start: 2017-02-01 — End: 2017-02-02
  Administered 2017-02-01 (×2): 1 [IU] via SUBCUTANEOUS
  Administered 2017-02-01: 5 [IU] via SUBCUTANEOUS
  Administered 2017-02-02 (×2): 2 [IU] via SUBCUTANEOUS

## 2017-02-01 MED ORDER — ALBUTEROL SULFATE (2.5 MG/3ML) 0.083% IN NEBU: 2.5000 mg | INHALATION_SOLUTION | RESPIRATORY_TRACT | Status: DC | PRN

## 2017-02-01 MED ORDER — ASPIRIN 81 MG PO CHEW
81.0000 mg | CHEWABLE_TABLET | Freq: Every day | ORAL | Status: DC
  Administered 2017-02-01 – 2017-02-02 (×2): 81 mg via ORAL
  Filled 2017-02-01 (×2): qty 1

## 2017-02-01 NOTE — Progress Notes (Signed)
PROGRESS NOTE    Shari Golden  AVW:[id number] DOB: 09/18/1930 DOA: 01/31/2017 PCP: Donzetta Sprung, MD   Brief Narrative:  83 y.o. BF PMHx Anemia, Anxiety, osteoarthritis, Paroxysmal Atrial Fibrillation, CAD of artery, HTN CKD stage III, constipation, type 2 Diabetes uncontrolled with complications (Peripheral Neuropathy)  Brought to the emergency department via EMS complaining of generalized weakness and fatigue for the past 5 hours. The patient also stated that she had a headache and abdominal pain earlier. Initial EMS evaluation shows a heart rate in the 30s. The patient believes that she took an dose of extra metoprolol and Cardizem by mistake, thinking that she had not taken it earlier. She is awake, alert, oriented 3, but has a history of mild dementia.  ED Course: Was given atropine 2 mg IVP for bradycardia, glucagon for beta blocker reversal, albuterol, calcium and insulin for hyperkalemia/hyperglycemia. Poison control was called by the ED.   Her EKG showed junctional rhythm at 35 BPM, in complete heart block and LVH. First troponin level was normal and BNP was 447 pg/mL. Her WBC 4.6, hemoglobin 9, platelets 158. Her sodium 134, potassium 6.8, chloride 103 and bicarbonate 22 mmol/L. Her BUN was 34, creatinine 2.75 and glucose 333 mg/dL. Her previous creatinine from 6 or 7 years ago was 1.23. A chest radiograph show mild retrocardiac atelectasis or possibly infection.   Subjective: 2/3 A/O 1 (does not know where, when, why). Pleasantly demented. Follows commands. Per her granddaughter [name] close to baseline. [name] states patient lives alone with a which comes to help out however believes it's time for her to manage higher-level care. This is at least the second time patient has accidentally taken more/last medication been prescribed, even though [name] fills her 7 day pill container with appropriate medication.    Assessment & Plan:   Principal Problem:  Bradycardia Active Problems:   Atrial fibrillation (HCC)   Type II diabetes mellitus (HCC)   Essential hypertension   Anemia   Hyperlipidemia   Hyperkalemia   Bradycardia -Secondary to accidental overmedication. -Resolved -Granddaughter believes patient unsafe to stay at home by herself. Requests help in SNF placement.   Atrial fibrillation (CHA2DS2-VASc Score of at least 6.) -Remotely documented without recurrence. -Restart metoprolol instead of Toprololat lower dose. Metoprolol 12.5 mg BID -Hold CCB  -Not on anticoagulation.  -The patient is only taking aspirin 81 mg by mouth daily    Type II diabetes mellitus (HCC) -Hemoglobin A1c pending -Lipid panel pending -Carbohydrate modified diet.. -Continue sensitive SSI   Essential hypertension -See A. fib    Anemia Monitor hematocrit and hemoglobin. Continue iron and vitamin C supplementation.    Hyperlipidemia -Continue simvastatin 20 mg by mouth daily.    Hyperkalemia -Calcium gluconate, insulin and albuterol given. -Follow-up potassium level. -Kayexalate 30 g 1 -Repeat K/Mg 1200  Goals of care -PT/OT consult patient's dementia worsening requires SNF evaluate placement -CLINICAL SOCIAL WORKER: Consult  Granddaughter [name] is HCPOA [phone] would like to discuss options for placement given patient's increasing care needs.      DVT prophylaxis: Lovenox Code Status: DO NOT RESUSCITATE Family Communication: Spoke with Granddaughter [name] is HCPOA [phone] Disposition Plan: Awaiting LCSW recommendations   Consultants:  None  Procedures/Significant Events:     VENTILATOR SETTINGS:    Cultures   Antimicrobials:    Devices    LINES / TUBES:      Continuous Infusions: . sodium chloride       Objective: Vitals:   01/31/17 2245  01/31/17 2315 02/01/17 0039 02/01/17 0519  BP: (!) 119/54 (!) 108/47 136/86 (!) 148/72  Pulse: (!) 43 (!) 40  73  Resp: 19 12 18 20    Temp:   97.5 F (36.4 C) 99.2 F (37.3 C)  TempSrc:   Oral Oral  SpO2: 96% 92% 98% 93%  Weight:   63 kg (138 lb 12.8 oz)   Height:        Intake/Output Summary (Last 24 hours) at 02/01/17 0827 Last data filed at 02/01/17 0600  Gross per 24 hour  Intake           652.08 ml  Output                0 ml  Net           652.08 ml   Filed Weights   01/31/17 2028 02/01/17 0039  Weight: 61.2 kg (135 lb) 63 kg (138 lb 12.8 oz)    Examination:  General: A/O 1 (does not know where, when, why). Pleasantly demented. Follows commands, No acute respiratory distress Eyes: negative scleral hemorrhage, negative anisocoria, negative icterus ENT: Negative Runny nose, negative gingival bleeding, Neck:  Negative scars, masses, torticollis, lymphadenopathy, JVD Lungs: Clear to auscultation bilaterally without wheezes or crackles Cardiovascular: Regular rate and rhythm without murmur gallop or rub normal S1 and S2 Abdomen: negative abdominal pain, nondistended, positive soft, bowel sounds, no rebound, no ascites, no appreciable mass Extremities: No significant cyanosis, clubbing, or edema bilateral lower extremities Skin: Negative rashes, lesions, ulcers Psychiatric:  Unable to evaluate secondary to dementia  Central nervous system:  Cranial nerves II through XII intact, tongue/uvula midline, all extremities muscle strength 5/5, sensation intact throughout, negative dysarthria, negative expressive aphasia, negative receptive aphasia.  .     Data Reviewed: Care during the described time interval was provided by me .  I have reviewed this patient's available data, including medical history, events of note, physical examination, and all test results as part of my evaluation. I have personally reviewed and interpreted all radiology studies.  CBC:  Recent Labs Lab 01/31/17 2117 01/31/17 2124 02/01/17 0406  WBC 4.6  --  6.2  NEUTROABS 3.2  --   --   HGB 9.0* 8.5* 9.3*  HCT 28.1* 25.0* 29.2*    MCV 94.6  --  93.3  PLT 158  --  182   Basic Metabolic Panel:  Recent Labs Lab 01/31/17 2124 01/31/17 2129 02/01/17 0406  NA 134* 134* 134*  K 6.6* 6.8* 5.5*  CL 104 103 103  CO2  --  22 23  GLUCOSE 326* 333* 322*  BUN 38* 34* 36*  CREATININE 2.80* 2.75* 2.46*  CALCIUM  --  9.0 9.1   GFR: Estimated Creatinine Clearance: 15 mL/min (by C-G formula based on SCr of 2.46 mg/dL (H)). Liver Function Tests: No results for input(s): AST, ALT, ALKPHOS, BILITOT, PROT, ALBUMIN in the last 168 hours. No results for input(s): LIPASE, AMYLASE in the last 168 hours. No results for input(s): AMMONIA in the last 168 hours. Coagulation Profile: No results for input(s): INR, PROTIME in the last 168 hours. Cardiac Enzymes: No results for input(s): CKTOTAL, CKMB, CKMBINDEX, TROPONINI in the last 168 hours. BNP (last 3 results) No results for input(s): PROBNP in the last 8760 hours. HbA1C: No results for input(s): HGBA1C in the last 72 hours. CBG:  Recent Labs Lab 02/01/17 0745  GLUCAP 258*   Lipid Profile: No results for input(s): CHOL, HDL, LDLCALC, TRIG, CHOLHDL, LDLDIRECT in the  last 72 hours. Thyroid Function Tests: No results for input(s): TSH, T4TOTAL, FREET4, T3FREE, THYROIDAB in the last 72 hours. Anemia Panel: No results for input(s): VITAMINB12, FOLATE, FERRITIN, TIBC, IRON, RETICCTPCT in the last 72 hours. Urine analysis: No results found for: COLORURINE, APPEARANCEUR, LABSPEC, PHURINE, GLUCOSEU, HGBUR, BILIRUBINUR, KETONESUR, PROTEINUR, UROBILINOGEN, NITRITE, LEUKOCYTESUR Sepsis Labs: @LABRCNTIP (procalcitonin:4,lacticidven:4)  ) Recent Results (from the past 240 hour(s))  MRSA PCR Screening     Status: None   Collection Time: 02/01/17 12:38 AM  Result Value Ref Range Status   MRSA by PCR NEGATIVE NEGATIVE Final    Comment:        The GeneXpert MRSA Assay (FDA approved for NASAL specimens only), is one component of a comprehensive MRSA colonization surveillance  program. It is not intended to diagnose MRSA infection nor to guide or monitor treatment for MRSA infections.          Radiology Studies: Dg Chest Portable 1 View  Result Date: 01/31/2017 CLINICAL DATA:  Acute onset of generalized weakness and fatigue. Bradycardia. Initial encounter. EXAM: PORTABLE CHEST 1 VIEW COMPARISON:  Chest radiograph performed 03/26/2013 FINDINGS: The lungs are well-aerated. Mild retrocardiac opacity may reflect atelectasis or possibly mild infection. There is no evidence of pleural effusion or pneumothorax. The cardiomediastinal silhouette is borderline normal in size. No acute osseous abnormalities are seen. External pacing pads are noted. IMPRESSION: Mild retrocardiac opacity may reflect atelectasis or possibly mild infection. Electronically Signed   By: Roanna RaiderJeffery  Chang M.D.   On: 01/31/2017 21:43        Scheduled Meds: . aspirin  81 mg Oral Daily  . enoxaparin (LOVENOX) injection  30 mg Subcutaneous QHS  . insulin aspart  0-9 Units Subcutaneous TID WC  . pantoprazole  40 mg Oral Daily  . simvastatin  20 mg Oral q1800   Continuous Infusions: . sodium chloride       LOS: 1 day    Time spent: 40 minutes    Shawntavia Saunders, Roselind MessierURTIS J, MD [facility] Pager [phone]   If 7PM-7AM, please contact night-coverage [email or web address] Password [email or web address] 02/01/2017, 8:27 AM

## 2017-02-01 NOTE — Consult Note (Signed)
   [facility] CM Inpatient Consult   02/01/2017  ASHIYA KINKEAD 04-Apr-1930 [id number]   Referral received. Patient evaluated for community based chronic disease management services with [facility] as a benefit of patient's [employer]. Chart review reveals the patient is NAJAT OLAZABAL is an 83 y.o. female with medical history significant of anemia, anxiety, osteoarthritis, paroxysmal atrial fibrillation, carotid artery disease, chronic kidney disease stage III, constipation, essential hypertension, GERD, type 2 diabetes with peripheral neuropathy who was brought to the emergency department via EMS complaining of generalized weakness and fatigue for the past 5 hours. Initial EMS evaluation shows a heart rate in the 30s. The patient believes that she took an dose of extra metoprolol and Cardizem by mistake, thinking that she had not taken it earlier. She is awake, alert, oriented 3, but has a history of mild dementia.  Met with the patient and her granddaughter at bedside to explain [facility] services. Explained that the inpatient care manager, Hassan Rowan, expressed assistance with medications and other community resources. Consent form signed by granddaughter, Aldona Bar.   Patient will receive post hospital discharge call and will be evaluated for monthly home visits for assessments and disease process education.  Left contact information and [facility] literature at bedside. Made Inpatient Case Manager aware that [place] [facility] following. Of note, [facility] Care Management services does not replace or interfere with any services that are arranged by inpatient case management or social work.  For additional questions or referrals please contact:     Natividad Brood, RN BSN [facility] Liaison  [phone] business mobile phone Toll free office [phone]

## 2017-02-01 NOTE — Progress Notes (Signed)
The client is having increasing confusion this afternoon and will not keep her heart monitor in place after repeated replacement and education. She has also pulled out her IV site. Notified MD Joseph ArtWoods. Will leave heart monitor off and no IV site. Thank you     Sheppard Evensina Minh Jasper RN

## 2017-02-01 NOTE — Progress Notes (Signed)
CS[facility] who stated pt was being moved to "observation" and that the pt's plans for eventual D/C would need to be in place as soon as possible.  RN on duty also stated that pt's daughter had felt it unsafe for pt to return home and placement would be necessary as a result.   Pt then received a second phone call from RN on duty who stated that it was recommended by a physician the CSW facilitate placement as soon as possible.  CSW then contacted pt's granddaughter Simmie DaviesSamantha Boyd at ph:[phone] and asked her if placement at a SNF for short-term rehab would be her preference for the pt.  Miss Leavy CellaBoyd stated "No, I do not want that".  CSW asked again for confirmation that she did not want the pt to receive care and short-term rehab at a SNF and pt's granddaughter again stated, "No, I do not want that.  I want her to come home and to get help in her home".  CSW confirmed this again a third time and then thanked the pt's granddaughter.  CSW relayed this information to the pt's RN who said she would relay this information to staff.    Dorothe PeaJonathan F. Jasmond River, Theresia MajorsLCSWA, LCAS Clinical Social Worker Ph: [phone]

## 2017-02-01 NOTE — Care Management Obs Status (Signed)
MEDICARE OBSERVATION STATUS NOTIFICATION   Patient Details  Name: Shari Golden MRN: [id number] Date of Birth: 09/19/1930   Medicare Observation Status Notification Given:  Yes    Elliot CousinShavis, Kiah Keay Ellen, RN 02/01/2017, 7:49 PM

## 2017-02-01 NOTE — Progress Notes (Signed)
Results for Shari Golden, Alvetta L (MRN [id number]) as of 02/01/2017 10:00  Ref. Range 01/31/2017 21:24 01/31/2017 21:29 02/01/2017 04:06  Glucose Latest Ref Range: 65 - 99 mg/dL 478326 (H) 295333 (H) 621322 (H)  Noted that lab glucoses were greater than 300 mg/dl on admission.  Recommend starting Lantus 15-20 units daily and continuing Novolog SENSITIVE correction scale TID & HS if eating. Noted that patient takes Lantus 5-8 units four times per day at home.

## 2017-02-01 NOTE — Care Management Note (Addendum)
Case Management Note  Patient Details  Name: Shari Golden MRN: [id number] Date of Birth: 09-07-1930  Subjective/Objective:   Pt presented for Weakness, Fatigue and Bradycardia. Pt is from home alone and she has the assistance of granddaughter Shari Golden [phone]. Pt has personal caregivers via [employer] [place] [employer]. M-F 6 hours per day and Sat 4 hours. Per granddaughter pt has been independent, however is showing signs of forgetfulness. Granddaughter Shari Golden is assisting with medications at this time. She fills the pillbox and she will be relocating the box due to patient in with bradycardia. No DME needs at home at this time. Per patient and granddaughter no home needs at this time.                Action/Plan: [facility] and CM did also call [facility]. Brochures given to patient and Granddaughter. [facility]- vm was left having them to call CM back in regards to new referral. CM will continue to monitor.   Expected Discharge Date:                  Expected Discharge Plan:  Home/Self Care  In-House Referral:  NA  Discharge planning Services  CM Consult  Post Acute Care Choice:  NA Choice offered to:  NA  DME Arranged:  N/A DME Agency:  NA  HH Arranged:  NA HH Agency:  NA  Status of Service:  Completed, signed off  If discussed at Long Length of Stay Meetings, dates discussed:    Additional Comments: [facility] referral made and they will f/u with [facility] for referral as well. No further needs from CM at this time. 02-01-17 1222. Tomi BambergerBrenda Graves-Bigelow, RN,BSN  [name], Shari LaundryBrenda Kaye, RN 02/01/2017, 11:29 AM

## 2017-02-01 NOTE — Evaluation (Signed)
Physical Therapy Evaluation Patient Details Name: Shari Golden MRN: [id number] DOB: [birth year] Today's Date: 02/01/2017   History of Present Illness  83 yo female admitted with bradycardia, weakness, fatigue. Hx of dementia, anemia, anxiety, OA, Afib, CAD, CKD, Dm, neuropathy  Clinical Impression  On eval, pt was supervision level assist for mobility. She walked ~400 feet without a device or external assistance. Mildly unsteady at times but no overt LOB. She tolerated distance well. Family present during session. Discussed d/c plan-family states pt will return home with family and aide assisting as before. No follow up PT needs. Pt likely needs increased supervision for cognitive reasons. May need to consider ALF in the future.     Follow Up Recommendations No PT follow up;Supervision/Assistance - Intermittent (may need to consider increased supervision for cognitive reasons)    Equipment Recommendations  None recommended by PT    Recommendations for Other Services       Precautions / Restrictions Precautions Precautions: Fall Restrictions Weight Bearing Restrictions: No      Mobility  Bed Mobility Overal bed mobility: Independent                Transfers Overall transfer level: Independent                  Ambulation/Gait Ambulation/Gait assistance: Supervision Ambulation Distance (Feet): 400 Feet Assistive device: None Gait Pattern/deviations: Step-through pattern     General Gait Details: mildly unsteady at times. No overt LOB. She tolerated distance well.   Stairs            Wheelchair Mobility    Modified Rankin (Stroke Patients Only)       Balance                                             Pertinent Vitals/Pain Pain Assessment: No/denies pain    Home Living Family/patient expects to be discharged to:: Private residence Living Arrangements: Alone Available Help at Discharge: Family;Personal care  attendant Type of Home: Apartment Home Access: Elevator     Home Layout: One level Home Equipment: None      Prior Function Level of Independence: Needs assistance   Gait / Transfers Assistance Needed: Pt ambulates without assist or a device  ADL's / Homemaking Assistance Needed: aide assists with household chores. Pt cooks her own meals.         Hand Dominance        Extremity/Trunk Assessment   Upper Extremity Assessment Upper Extremity Assessment: Overall WFL for tasks assessed    Lower Extremity Assessment Lower Extremity Assessment: Overall WFL for tasks assessed    Cervical / Trunk Assessment Cervical / Trunk Assessment: Normal  Communication   Communication: No difficulties  Cognition Arousal/Alertness: Awake/alert Behavior During Therapy: WFL for tasks assessed/performed Overall Cognitive Status: History of cognitive impairments - at baseline                      General Comments      Exercises     Assessment/Plan    PT Assessment Patient needs continued PT services  PT Problem List Decreased mobility       PT Treatment Interventions Gait training;Functional mobility training;Therapeutic activities;Therapeutic exercise;Patient/family education    PT Goals (Current goals can be found in the Care Plan section)       Frequency Min 3X/week  Barriers to discharge        Co-evaluation               End of Session Equipment Utilized During Treatment: Gait belt Activity Tolerance: Patient tolerated treatment well Patient left: in bed;with call bell/phone within reach;with family/visitor present;with bed alarm set   PT Visit Diagnosis: Unsteadiness on feet (R26.81)         Time: [id number] PT Time Calculation (min) (ACUTE ONLY): 20 min   Charges:   PT Evaluation $PT Eval Low Complexity: 1 Procedure     PT G Codes:         Rebeca AlertJannie Sindia Kowalczyk, MPT Pager: [phone]

## 2017-02-01 NOTE — Care Management CC44 (Signed)
Condition Code 44 Documentation Completed  Patient Details  Name: Shari Golden MRN: [id number] Date of Birth: February 22, 1930   Condition Code 44 given:  Yes Patient signature on Condition Code 44 notice:  Yes Documentation of 2 MD's agreement:  Yes Code 44 added to claim:  Yes    Elliot CousinShavis, Araceli Coufal Ellen, RN 02/01/2017, 7:49 PM

## 2017-02-02 DIAGNOSIS — R001 Bradycardia, unspecified: Secondary | ICD-10-CM | POA: Diagnosis not present

## 2017-02-02 DIAGNOSIS — T50901A Poisoning by unspecified drugs, medicaments and biological substances, accidental (unintentional), initial encounter: Secondary | ICD-10-CM | POA: Diagnosis not present

## 2017-02-02 DIAGNOSIS — E118 Type 2 diabetes mellitus with unspecified complications: Secondary | ICD-10-CM

## 2017-02-02 DIAGNOSIS — E784 Other hyperlipidemia: Secondary | ICD-10-CM

## 2017-02-02 DIAGNOSIS — T447X1A Poisoning by beta-adrenoreceptor antagonists, accidental (unintentional), initial encounter: Secondary | ICD-10-CM | POA: Diagnosis not present

## 2017-02-02 DIAGNOSIS — N183 Chronic kidney disease, stage 3 (moderate): Secondary | ICD-10-CM | POA: Diagnosis not present

## 2017-02-02 DIAGNOSIS — I480 Paroxysmal atrial fibrillation: Secondary | ICD-10-CM | POA: Diagnosis not present

## 2017-02-02 DIAGNOSIS — I10 Essential (primary) hypertension: Secondary | ICD-10-CM | POA: Diagnosis not present

## 2017-02-02 LAB — GLUCOSE, CAPILLARY
Glucose-Capillary: 152 mg/dL — ABNORMAL HIGH (ref 65–99)
Glucose-Capillary: 113 mg/dL — ABNORMAL HIGH (ref 65–99)
Glucose-Capillary: 170 mg/dL — ABNORMAL HIGH (ref 65–99)

## 2017-02-02 LAB — HEMOGLOBIN A1C
Hgb A1c MFr Bld: 7 % — ABNORMAL HIGH (ref 4.8–5.6)
Mean Plasma Glucose: 154 mg/dL

## 2017-02-02 MED ORDER — DILTIAZEM HCL ER COATED BEADS 180 MG PO CP24: 180.0000 mg | ORAL_CAPSULE | Freq: Every day | ORAL | Status: DC

## 2017-02-02 MED ORDER — METOPROLOL TARTRATE 25 MG PO TABS: 12.5000 mg | ORAL_TABLET | Freq: Two times a day (BID) | ORAL | 0 refills | Status: DC

## 2017-02-02 MED ORDER — DILTIAZEM HCL ER COATED BEADS 180 MG PO CP24
180.0000 mg | ORAL_CAPSULE | Freq: Every day | ORAL | Status: DC
  Administered 2017-02-02 (×2): 180 mg via ORAL
  Filled 2017-02-02 (×2): qty 1

## 2017-02-02 NOTE — Discharge Instructions (Signed)
Bradycardia, Adult °Bradycardia is a slower-than-normal heartbeat. A normal resting heart rate for an adult ranges from 60 to 100 beats per minute. With bradycardia, the resting heart rate is less than 60 beats per minute. °Bradycardia can prevent enough oxygen from reaching certain areas of your body when you are active. It can be serious if it keeps enough oxygen from reaching your brain and other parts of your body. Bradycardia is not a problem for everyone. For some healthy adults, a slow resting heart rate is normal. °What are the causes? °This condition may be caused by: °· A problem with the heart, including: °? A problem with the heart's electrical system, such as a heart block. °? A problem with the heart's natural pacemaker (sinus node). °? Heart disease. °? A heart attack. °? Heart damage. °? A heart infection. °? A heart condition that is present at birth (congenital heart defect). °· Certain medicines that treat heart conditions. °· Certain conditions, such as hypothyroidism and obstructive sleep apnea. °· Problems with the balance of chemicals and other substances, like potassium, in the blood. ° °What increases the risk? °This condition is more likely to develop in adults who: °· Are age 65 or older. °· Have high blood pressure (hypertension), high cholesterol (hyperlipidemia), or diabetes. °· Drink heavily, use tobacco or nicotine products, or use drugs. °· Are stressed. ° °What are the signs or symptoms? °Symptoms of this condition include: °· Light-headedness. °· Feeling faint or fainting. °· Fatigue and weakness. °· Shortness of breath. °· Chest pain (angina). °· Drowsiness. °· Confusion. °· Dizziness. ° °How is this diagnosed? °This condition may be diagnosed based on: °· Your symptoms. °· Your medical history. °· A physical exam. ° °During the exam, your health care provider will listen to your heartbeat and check your pulse. To confirm the diagnosis, your health care provider may order tests,  such as: °· Blood tests. °· An electrocardiogram (ECG). This test records the heart's electrical activity. The test can show how fast your heart is beating and whether the heartbeat is steady. °· A test in which you wear a portable device (event recorder or Holter monitor) to record your heart's electrical activity while you go about your day. °· An exercise test. ° °How is this treated? °Treatment for this condition depends on the cause of the condition and how severe your symptoms are. Treatment may involve: °· Treatment of the underlying condition. °· Changing your medicines or how much medicine you take. °· Having a small, battery-operated device called a pacemaker implanted under the skin. When bradycardia occurs, this device can be used to increase your heart rate and help your heart to beat in a regular rhythm. ° °Follow these instructions at home: °Lifestyle ° °· Manage any health conditions that contribute to bradycardia as told by your health care provider. °· Follow a heart-healthy diet. A nutrition specialist (dietitian) can help to educate you about healthy food options and changes. °· Follow an exercise program that is approved by your health care provider. °· Maintain a healthy weight. °· Try to reduce or manage your stress, such as with yoga or meditation. If you need help reducing stress, ask your health care provider. °· Do not use use any products that contain nicotine or tobacco, such as cigarettes and e-cigarettes. If you need help quitting, ask your health care provider. °· Do not use illegal drugs. °· Limit alcohol intake to no more than 1 drink per day for nonpregnant women and 2 drinks per   day for men. One drink equals 12 oz of beer, 5 oz of wine, or 1½ oz of hard liquor. °General instructions °· Take over-the-counter and prescription medicines only as told by your health care provider. °· Keep all follow-up visits as directed by your health care provider. This is important. °How is this  prevented? °In some cases, bradycardia may be prevented by: °· Treating underlying medical problems. °· Stopping behaviors or medicines that can trigger the condition. ° °Contact a health care provider if: °· You feel light-headed or dizzy. °· You almost faint. °· You feel weak or are easily fatigued during physical activity. °· You experience confusion or have memory problems. °Get help right away if: °· You faint. °· You have an irregular heartbeat (palpitations). °· You have chest pain. °· You have trouble breathing. °This information is not intended to replace advice given to you by your health care provider. Make sure you discuss any questions you have with your health care provider. °Document Released: 08/18/2002 Document Revised: 07/24/2016 Document Reviewed: 05/17/2016 °Elsevier Interactive Patient Education © 2017 Elsevier Inc. ° °

## 2017-02-02 NOTE — Progress Notes (Signed)
Spoke with patient family at the bedside she reassured RN that she would like for her grandmother to return home. [facility], and states she will get more help in the home. RN notified MD, will await DC orders

## 2017-02-02 NOTE — Discharge Summary (Signed)
Physician Discharge Summary  Shari MusselLaura L Golden MVH:[id number] DOB: September 07, 1930 DOA: 01/31/2017  PCP: Donzetta SprungERRY DANIEL, MD  Admit date: 01/31/2017 Discharge date: 02/02/2017  Time spent: 40 minutes  Recommendations for Outpatient Follow-up:  Bradycardia -Secondary to accidental overmedication. -Resolving -Metoprolol 12.5 mg BID. Counseled granddaughter that patient's HR has been low 60s and currently not safe to restart at full dose. May address if required at follow-up with cardiologist or PCP increasing medication -Cardizem CD 360 mg daily -Reduced patient's BP medication. Will schedule follow-up with Dr.Samuel Franky MachoG [place] cardiology in 1-2 weeks for adjustment of medication  Atrial fibrillation (CHA2DS2-VASc Scoreof at least 6.) -Remotely documented without recurrence. -Restart Metoprolol instead of Toprolol at lower dose. Metoprolol 12.5 mg BID -Not on anticoagulation.  -Continue Aspirin 81 mg by mouth daily  Type IIdiabetes mellitus controlled with complication -2/23 Hemoglobin A1c=7.0 -Restart patient's home medication regimen -Lipid panel within ADA guidelines   Essential hypertension -See A. fib   Anemia -Continue iron and vitamin C supplementation. -Schedule follow-up with Dr. Donzetta Sprungerry Daniel in 1-2 weeks anemia, HLD, hyperkalemia.  Hyperlipidemia -Continue simvastatin 20 mg by mouth daily.  Hyperkalemia -Resolved     Discharge Diagnoses:  Principal Problem:   Bradycardia Active Problems:   Atrial fibrillation (HCC)   Type II diabetes mellitus (HCC)   Essential hypertension   Anemia   Hyperlipidemia   Hyperkalemia   Accidental overdose   Controlled diabetes mellitus type 2 with complications [facility](HCC)   Discharge Condition: Stable  Diet recommendation: American diabetic Association/Heart Healthy  [facility]   01/31/17 2028 02/01/17 0039  Weight: 61.2 kg (135 lb) 63 kg (138 lb 12.8 oz)    History of present illness:  83 y.o.BF PMHx Anemia,  Anxiety, osteoarthritis, Paroxysmal Atrial Fibrillation, CAD of artery, HTN CKD stage III, constipation, type 2 Diabetes uncontrolled with complications (Peripheral Neuropathy)  Brought to the emergency department via EMS complaining of generalized weakness and fatigue for the past 5 hours. The patient also stated that she had a headache and abdominal pain earlier. Initial EMS evaluation shows a heart rate in the 30s. The patient believes that she took an dose of extra metoprolol and Cardizem by mistake, thinking that she had not taken it earlier. She is awake, alert, oriented 3, but has a history of mild dementia. During his hospitalization beta blockers were held and patient's bradycardia resolved. HR has returned to baseline: Showing that that was most likely secondary to accidental overdose secondary to her severe dementia. Spoke at length with granddaughter who has declined SNF placement. Patient stable for discharge       Discharge Exam: Vitals:   02/02/17 1457 02/02/17 1507 02/02/17 1618 02/02/17 1647  BP: (!) 182/63 (!) 183/69 (!) 177/72 (!) 163/62  Pulse:    (!) 59  Resp:    18  Temp:    98.8 F (37.1 C)  TempSrc:    Oral  SpO2:    99%  Weight:      Height:        General: A/O 1 (does not know where, when, why). Pleasantly demented. Follows commands, No acute respiratory distress Eyes: negative scleral hemorrhage, negative anisocoria, negative icterus ENT: Negative Runny nose, negative gingival bleeding, Neck:  Negative scars, masses, torticollis, lymphadenopathy, JVD Lungs: Clear to auscultation bilaterally without wheezes or crackles Cardiovascular: Regular rate and rhythm without murmur gallop or rub normal S1 and S2   Discharge Instructions  Discharge Instructions    [facility]    Complete by:  As directed    Reason for consult:  Post hospital follow up; medication management needs   Expected date of contact:  1-3 days (reserved for  hospital discharges)   Please assign to community nurse for transition of care calls and assess for home visits. Patient needs medication management assistance assessed. Please assess for PACE, granddaughter given information today.  Questions please call:   Charlesetta Shanks, RN BSN CCM [facility]  [phone] business mobile phone Toll free office [phone]     Allergies as of 02/02/2017      Reactions   Erythromycin Other (See Comments)   REACTION: Dehydration   Iodine Itching, Rash, Other (See Comments)   REACTION: dizzy   Iohexol Other (See Comments)    Code: HIVES, Desc: IODINE BASED CONTRAST   Latex Swelling, Other (See Comments)   REACTION: Swelling (If touched on face will have facial swelling)   Penicillins Itching, Rash      Medication List    STOP taking these medications   furosemide 40 MG tablet Commonly known as:  LASIX   metoprolol succinate 25 MG 24 hr tablet Commonly known as:  TOPROL-XL     TAKE these medications   acetaminophen 325 MG tablet Commonly known as:  TYLENOL Take 650 mg by mouth every 6 (six) hours as needed.   aspirin 81 MG chewable tablet Chew 81 mg by mouth daily.   diltiazem 360 MG 24 hr capsule Commonly known as:  CARDIZEM CD Take 360 mg by mouth daily.   insulin glargine 100 UNIT/ML injection Commonly known as:  LANTUS Inject 5-8 Units into the skin 4 (four) times daily as needed (sugar level - sliding scale). Sliding scale   metoprolol tartrate 25 MG tablet Commonly known as:  LOPRESSOR Take 0.5 tablets (12.5 mg total) by mouth 2 (two) times daily.   multivitamin Tabs tablet Take 1 tablet by mouth daily.   pantoprazole 40 MG tablet Commonly known as:  PROTONIX Take 40 mg by mouth daily.   simvastatin 20 MG tablet Commonly known as:  ZOCOR Take 20 mg by mouth daily.   VITRON-C 65-125 MG Tabs Generic drug:  Iron-Vitamin C Take 1 tablet by mouth daily.      Allergies  Allergen Reactions   . Erythromycin Other (See Comments)    REACTION: Dehydration  . Iodine Itching, Rash and Other (See Comments)    REACTION: dizzy  . Iohexol Other (See Comments)     Code: HIVES, Desc: IODINE BASED CONTRAST   . Latex Swelling and Other (See Comments)    REACTION: Swelling (If touched on face will have facial swelling)  . Penicillins Itching and Rash   Follow-up Information    Nona Dell, MD. Schedule an appointment as soon as possible for a visit in 2 day(s).   Specialty:  Cardiology Why:  Will schedule follow-up with Dr.Samuel Franky Macho cardiology in 1-2 weeks for adjustment of medication Contact information: [address] [place] [address] [phone]        Donzetta Sprung, MD. Schedule an appointment as soon as possible for a visit in 2 week(s).   Specialty:  Family Medicine Why:  Schedule follow-up with Dr. Donzetta Sprung in 1-2 weeks anemia, HLD, hyperkalemia. Contact information: [address] [place] [address] [phone]            The results of significant diagnostics from this hospitalization (including imaging, microbiology, ancillary and laboratory) are listed below for reference.    Significant Diagnostic  Studies: Dg Chest Portable 1 View  Result Date: 01/31/2017 CLINICAL DATA:  Acute onset of generalized weakness and fatigue. Bradycardia. Initial encounter. EXAM: PORTABLE CHEST 1 VIEW COMPARISON:  Chest radiograph performed 03/26/2013 FINDINGS: The lungs are well-aerated. Mild retrocardiac opacity may reflect atelectasis or possibly mild infection. There is no evidence of pleural effusion or pneumothorax. The cardiomediastinal silhouette is borderline normal in size. No acute osseous abnormalities are seen. External pacing pads are noted. IMPRESSION: Mild retrocardiac opacity may reflect atelectasis or possibly mild infection. Electronically Signed   By: Roanna Raider M.D.   On: 01/31/2017 21:43    Microbiology: Recent Results (from the past 240  hour(s))  MRSA PCR Screening     Status: None   Collection Time: 02/01/17 12:38 AM  Result Value Ref Range Status   MRSA by PCR NEGATIVE NEGATIVE Final    Comment:        The GeneXpert MRSA Assay (FDA approved for NASAL specimens only), is one component of a comprehensive MRSA colonization surveillance program. It is not intended to diagnose MRSA infection nor to guide or monitor treatment for MRSA infections.      Labs: Basic Metabolic Panel:  Recent Labs Lab 01/31/17 2124 01/31/17 2129 02/01/17 0406 02/01/17 1131  NA 134* 134* 134*  --   K 6.6* 6.8* 5.5* 5.0  CL 104 103 103  --   CO2  --  22 23  --   GLUCOSE 326* 333* 322*  --   BUN 38* 34* 36*  --   CREATININE 2.80* 2.75* 2.46*  --   CALCIUM  --  9.0 9.1  --   MG  --   --   --  2.0   Liver Function Tests: No results for input(s): AST, ALT, ALKPHOS, BILITOT, PROT, ALBUMIN in the last 168 hours. No results for input(s): LIPASE, AMYLASE in the last 168 hours. No results for input(s): AMMONIA in the last 168 hours. CBC:  Recent Labs Lab 01/31/17 2117 01/31/17 2124 02/01/17 0406  WBC 4.6  --  6.2  NEUTROABS 3.2  --   --   HGB 9.0* 8.5* 9.3*  HCT 28.1* 25.0* 29.2*  MCV 94.6  --  93.3  PLT 158  --  182   Cardiac Enzymes: No results for input(s): CKTOTAL, CKMB, CKMBINDEX, TROPONINI in the last 168 hours. BNP: BNP (last 3 results)  Recent Labs  01/31/17 2117  BNP 147.0*    ProBNP (last 3 results) No results for input(s): PROBNP in the last 8760 hours.  CBG:  Recent Labs Lab 02/01/17 1613 02/01/17 2114 02/02/17 0739 02/02/17 1144 02/02/17 1633  GLUCAP 141* 173* 152* 113* 170*       Signed:  Carolyne Littles, MD [facility] [phone] pager

## 2017-02-04 ENCOUNTER — Other Ambulatory Visit: Payer: Self-pay

## 2017-02-04 ENCOUNTER — Telehealth: Payer: Self-pay | Admitting: Cardiology

## 2017-02-04 NOTE — Telephone Encounter (Signed)
Sure, that would be fine with me. Whatever is more convenient for them.

## 2017-02-04 NOTE — Telephone Encounter (Signed)
Per [place] Granddaughter [place]ari [place]( [place]) she is now living in [place] and [facility] . Will that be alright with you ?  Thanks   [employer]ie

## 2017-02-06 ENCOUNTER — Other Ambulatory Visit: Payer: Self-pay | Admitting: Pharmacist

## 2017-02-06 NOTE — Patient Outreach (Signed)
[place]ari [place] was referred to pharmacy for medication assistance. Place coordination of care call to [facility] [place]. [place] lets me know that [name] consented for us to contact her granddaughter regarding medication assistance. [place] provides me with the contact information for the patient's granddaughter, Shari [place] [phone]).  Left a HIPAA compliant message on the patient's granddaughter's voicemail. If have not heard back by 02/08/17, will give her another call at that time.  Duanne MoronElisabeth Tata Timmins, PharmD, [facility]ACP Clinical Pharmacist [facility] Management [phone]

## 2017-02-07 ENCOUNTER — Encounter: Payer: Self-pay | Admitting: Nurse Practitioner

## 2017-02-07 ENCOUNTER — Ambulatory Visit (INDEPENDENT_AMBULATORY_CARE_PROVIDER_SITE_OTHER): Payer: Medicare Other | Admitting: Nurse Practitioner

## 2017-02-07 ENCOUNTER — Other Ambulatory Visit: Payer: Self-pay

## 2017-02-07 VITALS — BP 186/70 | HR 74 | Ht 66.0 in | Wt 140.2 lb

## 2017-02-07 DIAGNOSIS — I10 Essential (primary) hypertension: Secondary | ICD-10-CM

## 2017-02-07 DIAGNOSIS — R001 Bradycardia, unspecified: Secondary | ICD-10-CM | POA: Diagnosis not present

## 2017-02-07 DIAGNOSIS — E782 Mixed hyperlipidemia: Secondary | ICD-10-CM | POA: Diagnosis not present

## 2017-02-07 MED ORDER — METOPROLOL TARTRATE 25 MG PO TABS: 25.0000 mg | ORAL_TABLET | Freq: Two times a day (BID) | ORAL | 6 refills | Status: DC

## 2017-02-07 NOTE — Patient Instructions (Signed)
Your physician has recommended you make the following change in your medication:   1.) the lopressor ( metoprolol) has been increased from 1/2 tablet twice( 12.5 mg) a day to 1 tablet twice a day. (25 mg)  A new prescription has been sent to your pharmacy to reflect this change.  Your physician wants you to follow-up in: 6 months or sooner if needed with Dr Chilton Siiffany [place]. You will receive a reminder letter in the mail two months in advance. If you don't receive a letter, please call our office to schedule the follow-up appointment.  If you need a refill on your cardiac medications before your next appointment, please call your pharmacy.

## 2017-02-07 NOTE — Progress Notes (Signed)
Office Visit    Patient Name: Shari Golden Date of Encounter: 02/07/2017  Primary Care Provider:  Donzetta Sprung, MD Primary Cardiologist:  Previously Lovena Neighbours, MD and then Ival Bible, MD. Pt now living in [place] and wishes to establish care here.  Will f/u with Lavell Islam, MD   Chief Complaint     83 year old female with a prior history of hypertension, remote Afib, DM II, CKD III, and mild carotid dzs, who presents for f/u after recent hospitalization for bradycardia in the setting of  blocker and CCB overdose.  Past Medical History    Past Medical History:  Diagnosis Date  . Anemia   . Anxiety   . Arthritis   . Carotid artery disease (HCC)    a. 12/2015 Carotid u/s: 1-39% bilat ICA stenosis-->f/u prn.  . CKD (chronic kidney disease), stage III    Dr. Fausto Skillern  . Constipation   . Essential hypertension   . GERD (gastroesophageal reflux disease)   . History of cardiac catheterization    a. 2004 Cath: Normal coronaries.  Marland Kitchen PAF (paroxysmal atrial fibrillation) (HCC)    a. Remotely documented without recurrence.  . Peripheral neuropathy (HCC)   . Type II diabetes mellitus (HCC)    a. 01/2017 HbA1c 7.0.   Past Surgical History:  Procedure Laterality Date  . ABDOMINAL HYSTERECTOMY    . APPENDECTOMY    . CARPAL TUNNEL RELEASE Bilateral   . LUMBAR LAMINECTOMY  ~ 2011  . SHOULDER ARTHROSCOPY W/ ROTATOR CUFF REPAIR Left   . TONSILLECTOMY  2012  . WRIST FUSION Right 04/01/2013   Procedure: RIGHT WRIST ARTHRODESIS WITH POSSIBLE LOCAL BONE GRAFT;  Surgeon: Sharma Covert, MD;  Location: [facility] OR;  Service: Orthopedics;  Laterality: Right;    Allergies  Allergies  Allergen Reactions  . Erythromycin Other (See Comments)    REACTION: Dehydration  . Iodine Itching, Rash and Other (See Comments)    REACTION: dizzy  . Iohexol Other (See Comments)     Code: HIVES, Desc: IODINE BASED CONTRAST   . Latex Swelling and Other (See Comments)    REACTION: Swelling (If touched on  face will have facial swelling)  . Penicillins Itching and Rash    History of Present Illness    83 year old female with the above complex past medical history including hypertension, mild carotid disease, stage III chronic kidney disease, type 2 diabetes mellitus, GERD, peripheral neuropathy, and remote atrial fibrillation. She had previously been followed by Dr. Myrtis Ser, and was last seen in January 2017 by Dr. Diona Browner. Patient has since moved to [place] would like to establish care here. She was recently hospitalized at [facility] with complaints of fatigue in the setting of calcium channel blocker and beta blocker overdose. She had accidentally taken an additional dose of each that morning. Her heart rate was in the 30s in the emergency department with junctional bradycardia. She was observed by the medicine service and AV nodal blocking agents were held. With that, heart rate recovered. She was resumed on Cardizem 360 mg daily as well as metoprolol 12.5 mg twice a day, which was a reduction in her previous dose. Since discharge, she has done well. A family member now prepares her medications on a daily basis. The patient lives by herself but does have a nursing assistant that stays with her for most of the day. She goes to dancing weekly with her granddaughter does well without any chest pain, dyspnea, palpitations. Further, she denies PND, orthopnea, dizziness,  syncope, edema, or early satiety.  Home Medications    Prior to Admission medications   Medication Sig Start Date End Date Taking? Authorizing Provider  acetaminophen (TYLENOL) 325 MG tablet Take 650 mg by mouth every 6 (six) hours as needed.   Yes Historical Provider, MD  aspirin 81 MG chewable tablet Chew 81 mg by mouth daily.   Yes Historical Provider, MD  diltiazem (CARDIZEM CD) 360 MG 24 hr capsule Take 360 mg by mouth daily.   Yes Historical Provider, MD  insulin glargine (LANTUS) 100 UNIT/ML injection Inject 8 Units into the skin  4 (four) times daily as needed (sugar level - sliding scale if less than 150). Sliding scale   Yes Historical Provider, MD  Iron-Vitamin C (VITRON-C) 65-125 MG TABS Take 1 tablet by mouth daily.   Yes Historical Provider, MD  metoprolol tartrate (LOPRESSOR) 25 MG tablet Take 0.5 tablets (12.5 mg total) by mouth 2 (two) times daily. 02/02/17  Yes Drema Dallasurtis J Woods, MD  multivitamin (RENA-VIT) TABS tablet Take 1 tablet by mouth daily.     Yes Historical Provider, MD  pantoprazole (PROTONIX) 40 MG tablet Take 40 mg by mouth daily.     Yes Historical Provider, MD  simvastatin (ZOCOR) 20 MG tablet Take 20 mg by mouth daily.    Yes Historical Provider, MD    Review of Systems    Overall doing well.  She denies chest pain, palpitations, dyspnea, pnd, orthopnea, n, v, dizziness, syncope, edema, weight gain, or early satiety. All other systems reviewed and are otherwise negative except as noted above.  Physical Exam    VS:  BP (!) 186/70   Pulse 74   Ht 5\' 6"  (1.676 m)   Wt 140 lb 3.2 oz (63.6 kg)   SpO2 96%   BMI 22.63 kg/m  , BMI Body mass index is 22.63 kg/m. GEN: Well nourished, well developed, in no acute distress.  HEENT: normal.  Neck: Supple, no JVD, carotid bruits, or masses. Cardiac: Irregular, no murmurs, rubs, or gallops. No clubbing, cyanosis, edema.  Radials/DP/PT 2+ and equal bilaterally.  Respiratory:  Respirations regular and unlabored, clear to auscultation bilaterally. GI: Soft, nontender, nondistended, BS + x 4. MS: no deformity or atrophy. Skin: warm and dry, no rash. Neuro:  Strength and sensation are intact. Psych: Normal affect.  Accessory Clinical Findings    ECG - Sinus rhythm, 76, atrial bigeminy, no acute ST or T changes.  Assessment & Plan    1.  Junctional bradycardia: Patient was recently admitted secondary to beta blocker and calcium channel blocker overdose with fatigue and junctional bradycardia with rates in the 30s. Medications were adjusted and  bradycardia resolved. Since hospitalization, she has done quite well. Calcium channel blocker was resumed at discharge and beta blocker was resumed at a lower dose. Heart rate stable today at 76 without evidence of heart block. She does have frequent PACs, which are asymptomatic. She is currently on 12.5 of metoprolol twice a day. I will increase this to 25 mg twice a day in the setting of elevated blood pressures.  2. Essential hypertension: Blood pressure is elevated today at 186/70. Family member states that blood pressures often in the 140s to 150s at home. Given frequent PACs and elevated pressure, I will titrate her metoprolol to 25 mg twice a day. She does have vital signs checked daily by a nursing assistant that comes to her home.  3. Remote atrial fibrillation: No apparent recurrence in many, many years. Patient is  not on oral anticoagulation and remains on aspirin. Continue beta blocker therapy.  4. Hyperlipidemia: She remains on simvastatin therapy with a recent LDL of 32.  5. Disposition: Patient was previous followed in [place] but wishes to establish care in the [place] area. She will follow-up with Dr. Duke Salvia in 4-6 months or sooner if necessary.  Nicolasa Ducking, NP 02/07/2017, 10:43 AM

## 2017-02-08 ENCOUNTER — Telehealth: Payer: Self-pay | Admitting: Nurse Practitioner

## 2017-02-08 ENCOUNTER — Other Ambulatory Visit: Payer: Self-pay

## 2017-02-08 ENCOUNTER — Other Ambulatory Visit: Payer: Self-pay | Admitting: Pharmacist

## 2017-02-08 NOTE — Telephone Encounter (Signed)
Returned call to [place] Satter[place] RN.Dr.Marueno advised no lasix due to elevated creatinine.Advised low salt diet,elevate legs,compression stockings.Advised to call back if needed.

## 2017-02-08 NOTE — Telephone Encounter (Signed)
New Message   Shari EnergyVictoria Golden called regarding swelling in pt ankles and feet. Wants to know if she should continue to stay off lasix or start it back up.Shari Golden.Shari Golden.Has gained 5lbs since discharge on the Feb 22. Requesting call back

## 2017-02-08 NOTE — Patient Outreach (Signed)
Shari Golden was [facility]ia[facility] for medication assistance and assessment of medication management. Called and spoke with patient's caregiver and granddaughter, Shari Golden [phone]). Patient's HIPAA identifiers verified.  Ms. Shari Golden denies any current medication management or assistance needs for the patient. Provide Ms. Shari Golden with my phone number for any future medication questions/concerns.  Place a coordination of care call to Shari Golden. RNCM reports that Ms. Shari Golden is a nurse herself and is currently filling the patient's pillbox for her and managing the patient's medications. Let RNCM know that I will close pharmacy episode at this time.   Shari Golden, PharmD, [facility]ACP Clinical Pharmacist [facility] Care Management [phone]

## 2017-02-08 NOTE — Patient Outreach (Signed)
[facility]) Care Management  Late entry for 02/04/2017  Successful outreach completed with patient's granddaughter. Spoke with her briefly to scheduled home visit for later this week.  TurkeyVictoria R. Chrisanna Mishra, RN, BSN, CCM [facility] Care Management Coordinator [phone]

## 2017-02-08 NOTE — Patient Outreach (Signed)
[facility]) Care Management  [facility] Care Manager  02/08/2017  Late entry for 02/07/2017   Shari Golden [birth year] [id number]  Subjective: Home visit completed with patient and her grand-daughter, [name].  Objective: Blood pressure (!) 160/64, pulse (!) 59, SpO2 97 %.  Encounter Medications:  Outpatient Encounter Prescriptions as of 02/07/2017  Medication Sig Note  . acetaminophen (TYLENOL) 325 MG tablet Take 650 mg by mouth every 6 (six) hours as needed.   Marland Kitchen aspirin 81 MG chewable tablet Chew 81 mg by mouth daily.   Marland Kitchen diltiazem (CARDIZEM CD) 360 MG 24 hr capsule Take 360 mg by mouth daily.   . insulin glargine (LANTUS) 100 UNIT/ML injection Inject 8 Units into the skin 4 (four) times daily as needed (sugar level - sliding scale if less than 150). Sliding scale 02/07/2017: Patient takes 5 units once a day if blood sugar is less than 150. If blood sugar is greater than 150, she takes 8 units   . Iron-Vitamin C (VITRON-C) 65-125 MG TABS Take 1 tablet by mouth daily.   . metoprolol tartrate (LOPRESSOR) 25 MG tablet Take 1 tablet (25 mg total) by mouth 2 (two) times daily.   . multivitamin (RENA-VIT) TABS tablet Take 1 tablet by mouth daily.     . pantoprazole (PROTONIX) 40 MG tablet Take 40 mg by mouth daily.     . simvastatin (ZOCOR) 20 MG tablet Take 20 mg by mouth daily.     No facility-administered encounter medications on file as of 02/07/2017.     Functional Status:  In your present state of health, do you have any difficulty performing the following activities: 02/07/2017 02/02/2017  Hearing? N N  Vision? N N  Difficulty concentrating or making decisions? Malvin Johns  Walking or climbing stairs? N N  Dressing or bathing? N N  Doing errands, shopping? Malvin Johns  Preparing Food and eating ? N -  Using the Toilet? N -  In the past six months, have you accidently leaked urine? N -  Do you have problems with loss of bowel control? N -  Managing your Medications? Y -  Managing your  Finances? Y -  Housekeeping or managing your Housekeeping? N -  Some recent data might be hidden    Fall/Depression Screening: PHQ 2/9 Scores 02/07/2017  PHQ - 2 Score 0    Assessment: Patient is awake, alert and oriented during this visit. She currently has no complaints and denies any pain.  Patient's grand-daughter, [name] was present during home visit. She is the healthcare POA and is also a Engineer, civil (consulting). Shari Golden fills patient's medication box weekly. She and her husband have been coming by twice a day to ensure patient is taking her medications as prescribed and not taking extra medications from the box. [name] does not typically leave patient's medications at her home so that patient will not accidentally take more than she is supposed to. She stated that the patient is able to afford all of her medications and has been taking them as prescribed since her hospital discharge. Once concern she has is that the patient was taken off her lasix while in the hospital and is not currently on anything. Patient denies any shortness of breath, but does have increased edema in her bilateral lower extremities. Prior to hospitalization, she was on Lasix 20 mg every other day. Her discharge weight was 135 on 2/22 and her reported weight today was 140. Her grand-daughter is not aware of why she was taken  off this medication and does not know what her last labs for kidney function showed. She did state that patient had been on her feet a lot today as she had other appointments this morning, but forgot to mention to the doctor about the swelling. She does not remember if the doctor looked at patient's feet and ankles today. Patient is currently asymptomatic and RNCM educated when to call doctor and when to call 911 related to worsening of symptoms. RNCM will also let cardiologist know of swelling.  Patient checks her blood sugar daily. Her last A1c was 7.  Patient reported being very active. She currently exercises  daily by walking about 10 -30 minutes a day. She also takes an [place] dance class once a week that her grand-daughter teaches. She is ambulatory with no assistive equipment and denies any instability or falls.   Patient is doing very well since discharge home and has no complaints or concerns. RNCM will follow for transition of care and patient and her granddaughter are aware and agreeable.  Plan:   [facility] Care [facility] Problem One   Flowsheet Row Most Recent Value  Care Plan Problem One  Recent hospital admission for bradycardia related to accidental overmedication  Role Documenting the Problem One  Care Management Coordinator  Care Plan for Problem One  Active  [facility] Long Term Goal (31-90 days)  Patient will not have a readmission to the hospital within the next 31 days  [facility] Long Term Goal Start Date  02/07/17  Interventions for Problem One Long Term Goal  RNCM assess patient's medication regimen (grand-daughter is now managing all medications),  provided education about taking all medications as instructed and attending all appointments with providers.  [facility] CM Short Term Goal #1 (0-30 days)  Patient will take all medications as prescribed for the next 30 days  [facility] CM Short Term Goal #1 Start Date  02/07/17  Interventions for Short Term Goal #1  RNCM provided education about importance of taking medications as prescribed. Also verified grand-daughter is now managing medications and filling a pillbox.   [facility] CM Short Term Goal #2 (0-30 days)  Patient will attend all provider appointments within the next 30 days  [facility] CM Short Term Goal #2 Start Date  02/07/17  Interventions for Short Term Goal #2  RNCM provided instruction about importance of attending all appointments and alerting providers of any changes.     RNCM will follow up with PCP regarding increased edema.  Will follow up next week for continued TOC.  TurkeyVictoria R. Jairon Ripberger, RN, BSN, CCM [facility] Care Management Coordinator [phone]  [phone]

## 2017-02-08 NOTE — Patient Outreach (Signed)
[facility]) Care Management  02/08/17  Shari Golden 1930-08-05 [id number]  RN[facility] and s/w Bridgette. Advised of home visit to see patient and concerns of patient having edema in bilateral lower extremities and weight gain of 5 lbs since discharge. No other symptoms. Advised patient had previously been on lasix, but unsure if she was to resume. Also not aware of patient's kidney function. Requested callback.  RNCM received [facility] from [facility] who advised that per Dr. Duke Salviaandolph, patient could not resume lasix due to kidney function. Advised need to encourage patient to elevate extremities, avoid salt and could try compression socks. RNCM to reach out to patient's grand-daughter to advise.  RNCM completed successful outreach to patient's granddaughter Shari Golden. Patient identification verified. RNCM advised of Dr. Leonides Sakeandolph's instructions and reason patient cannot resume lasix. She verbalized understanding and stated that she would try to get patient some compression socks and encourage her to elevate her legs.   RNCM encouraged Shari Golden to call back with any additional questions or concerns and to let doctor know if she has any worsening symptoms.  Shari R. Anora Schwenke, RN, BSN, CCM [facility] Care Management Coordinator [phone]

## 2017-02-08 NOTE — Telephone Encounter (Signed)
Returned call to Coca-ColaVictoria Satterfield RN.Stated she saw patient this afternoon and she is having a lot of swelling in both lower legs.Stated Lasix was stopped when she went to hospital.Advised I will speak to Dr.Gregory and call her back.

## 2017-02-11 DIAGNOSIS — E1122 Type 2 diabetes mellitus with diabetic chronic kidney disease: Secondary | ICD-10-CM | POA: Diagnosis not present

## 2017-02-11 DIAGNOSIS — E119 Type 2 diabetes mellitus without complications: Secondary | ICD-10-CM | POA: Diagnosis not present

## 2017-02-11 DIAGNOSIS — E1142 Type 2 diabetes mellitus with diabetic polyneuropathy: Secondary | ICD-10-CM | POA: Diagnosis not present

## 2017-02-12 NOTE — Telephone Encounter (Signed)
Ok with me 

## 2017-02-12 NOTE — Progress Notes (Signed)
   02/01/17 1409  PT Time Calculation  PT Start Time (ACUTE ONLY) 1347  PT Stop Time (ACUTE ONLY) 1407  PT Time Calculation (min) (ACUTE ONLY) 20 min  PT G-Codes **NOT FOR INPATIENT CLASS**  Functional Assessment Tool Used Clinical judgement  Functional Limitation Mobility: Walking and moving around  Mobility: Walking and Moving Around Current Status (Z6109(G8978) CI  Mobility: Walking and Moving Around Goal Status (U0454(G8979) [facility]  PT General Charges  $$ ACUTE PT VISIT 1 Procedure  PT Evaluation  $PT Eval Low Complexity 1 Procedure   Rebeca AlertJannie Peri Kreft, MPT [phone]

## 2017-02-13 ENCOUNTER — Telehealth: Payer: Self-pay | Admitting: Nurse Practitioner

## 2017-02-13 NOTE — Telephone Encounter (Signed)
Shari DaviesSamantha Golden is calling on behalf of patient, has a few questions about her medications. Would not go into detail. Please call, thanks.

## 2017-02-13 NOTE — Telephone Encounter (Signed)
Attempt to return call-# incorrect.  No one by this name at # given.   Attempted to call mobile listed in chart-no answer, lmtcb.

## 2017-02-14 MED ORDER — METOPROLOL SUCCINATE ER 25 MG PO TB24: 25.0000 mg | ORAL_TABLET | Freq: Every day | ORAL | 3 refills | Status: DC

## 2017-02-14 NOTE — Telephone Encounter (Signed)
Reviewed Dr. Blanchie DessertHilty's advice with patient's granddaughter who verbalized understanding and agreement with plan.  I have updated medication list.

## 2017-02-14 NOTE — Telephone Encounter (Signed)
Ok to return back to the Toprol XL 25 mg daily.  Dr. Rennis GoldenHilty

## 2017-02-14 NOTE — Telephone Encounter (Signed)
Spoke with Aldona Bar, patient's granddaughter and DPR, who states patient's BP is high since seeing Ignacia Bayley, NP on 3/1.  She states metoprolol was changed from Toprol XL 25 mg QD to Lopressor 12.5 mg BID. She states since making that change, patient has less energy and higher BP. She states BP last night was 172/75 mmHg and is 140/90 today. She states the patient was recently hospitalized because she accidentally took 2 doses of her Toprol 25 mg and Cardizem 360 mg in the same day which resulted in bradycardia. Granddaughter states patient seemed to tolerate the previous regimen better and would like patient to return to taking the Toprol 25 mg daily in the place of Lopressor 12.5 mg BID. I advised that I will route message to DOD as C. Sharolyn Douglas, NP is out of the office today and tomorrow. Patient will see Dr. Oval Linsey in the future but has not met her yet.

## 2017-02-14 NOTE — Telephone Encounter (Signed)
Left message for patient's granddaughter [name] to call back

## 2017-02-19 DIAGNOSIS — I10 Essential (primary) hypertension: Secondary | ICD-10-CM | POA: Diagnosis not present

## 2017-02-19 DIAGNOSIS — Z23 Encounter for immunization: Secondary | ICD-10-CM | POA: Diagnosis not present

## 2017-02-19 DIAGNOSIS — E1142 Type 2 diabetes mellitus with diabetic polyneuropathy: Secondary | ICD-10-CM | POA: Diagnosis not present

## 2017-02-19 DIAGNOSIS — E1122 Type 2 diabetes mellitus with diabetic chronic kidney disease: Secondary | ICD-10-CM | POA: Diagnosis not present

## 2017-02-19 DIAGNOSIS — G301 Alzheimer's disease with late onset: Secondary | ICD-10-CM | POA: Diagnosis not present

## 2017-02-19 DIAGNOSIS — E782 Mixed hyperlipidemia: Secondary | ICD-10-CM | POA: Diagnosis not present

## 2017-02-22 ENCOUNTER — Telehealth: Payer: Self-pay | Admitting: Cardiovascular Disease

## 2017-02-22 NOTE — Telephone Encounter (Signed)
Closed encounter °

## 2017-03-01 ENCOUNTER — Other Ambulatory Visit: Payer: Self-pay

## 2017-03-01 NOTE — Patient Outreach (Signed)
[facility]) Care Management  03/01/17  Lottie MusselLaura L Hogans 1930/09/15 [id number]  Attempted to reach patient and her granddaughter, Simmie DaviesSamantha Boyd without success. Left HIPAA compliant voicemail with RNCM contact information and invited callback.  TurkeyVictoria R. Jayvan Mcshan, RN, BSN, CCM [facility] Care Management Coordinator [phone]

## 2017-03-05 ENCOUNTER — Ambulatory Visit: Payer: Self-pay

## 2017-03-08 ENCOUNTER — Other Ambulatory Visit: Payer: Self-pay

## 2017-03-08 NOTE — Patient Outreach (Signed)
[facility]) Care Management  03/08/17  Shari Golden 1930-09-29 [id number]  Successful outreach completed with patient's granddaughter, Shari Golden. Patient identity verified.  Per [name], Ms. Abeyta is doing well. She stated that she has been feeling good and is taking her medications as prescribed. She denies any issues since she has been filling patient's pillbox and not leaving medications at her home.  She did state that she had to call the cardiologist because her blood pressures continue to be high, but they put her back on Toprol and that has helped.   She stated that she is concerned because her grandmother has water damage in her apartment and she now has a large bill to pay for it. She stated that her grandmother is expected to pay $675 within the next month. She stated that this has already happened before and she is still paying $50 per month for a previous time. She is concerned that she may have to move her and is currently looking at options. May need SW referral to assist with finding another facility.  [name] has no other questions or concerns. She stated that she does not have to be present for next home visit and to just go to facility where patient is located. She stated that she has an aide with her til 2 pm daily that can assist with answering any questions.   Plan: Will continue to follow and assess if new goal is to move to another facility.  Turkey R. Lorree Millar, RN, BSN, CCM [facility] Care Management Coordinator [phone]

## 2017-03-20 DIAGNOSIS — E1122 Type 2 diabetes mellitus with diabetic chronic kidney disease: Secondary | ICD-10-CM | POA: Diagnosis not present

## 2017-04-12 ENCOUNTER — Other Ambulatory Visit: Payer: Self-pay

## 2017-04-12 NOTE — Patient Outreach (Signed)
[facility]) Care Management  04/12/17  Shari Golden 07/28/1930 [id number]  Attempted to reach patient and her granddaughter, [name] without success. Left HIPAA compliant voicemail with RNCM contact information and invited callback.  [name], RN, BSN, CCM [facility] Care Management Coordinator [phone]

## 2017-04-16 ENCOUNTER — Ambulatory Visit: Payer: Medicare Other

## 2017-04-16 DIAGNOSIS — H40013 Open angle with borderline findings, low risk, bilateral: Secondary | ICD-10-CM | POA: Diagnosis not present

## 2017-04-16 DIAGNOSIS — Z9849 Cataract extraction status, unspecified eye: Secondary | ICD-10-CM | POA: Diagnosis not present

## 2017-04-16 DIAGNOSIS — H35373 Puckering of macula, bilateral: Secondary | ICD-10-CM | POA: Diagnosis not present

## 2017-04-16 DIAGNOSIS — Z961 Presence of intraocular lens: Secondary | ICD-10-CM | POA: Diagnosis not present

## 2017-04-17 ENCOUNTER — Other Ambulatory Visit: Payer: Self-pay

## 2017-04-17 NOTE — Patient Outreach (Signed)
[facility]) Care Management  [facility] Social Work  04/17/2017  Shari Golden June 25, 1930 [id number]  Subjective:  Home visit completed with patient. Her in-home aide was also present. Patient has no complaints today. She stated that she has been feeling well except for occasional headaches.  Objective:   Date BS BP HR  5/9 148 159/72 66  5/8 110 172/77 65  5/7 139 144/63 60  5/6 133 171/86 71  5/5 122    5/4 171 158/89 65  5/3 103 138/75 66     Encounter Medications:  Outpatient Encounter Prescriptions as of 04/17/2017  Medication Sig Note  . acetaminophen (TYLENOL) 325 MG tablet Take 650 mg by mouth every 6 (six) hours as needed.   Marland Kitchen aspirin 81 MG chewable tablet Chew 81 mg by mouth daily.   Marland Kitchen diltiazem (CARDIZEM CD) 360 MG 24 hr capsule Take 360 mg by mouth daily.   . insulin glargine (LANTUS) 100 UNIT/ML injection Inject 8 Units into the skin 4 (four) times daily as needed (sugar level - sliding scale if less than 150). Sliding scale 02/07/2017: Patient takes 5 units once a day if blood sugar is less than 150. If blood sugar is greater than 150, she takes 8 units   . Iron-Vitamin C (VITRON-C) 65-125 MG TABS Take 1 tablet by mouth daily.   . metoprolol succinate (TOPROL XL) 25 MG 24 hr tablet Take 1 tablet (25 mg total) by mouth daily.   . multivitamin (RENA-VIT) TABS tablet Take 1 tablet by mouth daily.     . pantoprazole (PROTONIX) 40 MG tablet Take 40 mg by mouth daily.     . sertraline (ZOLOFT) 50 MG tablet Take 50 mg by mouth daily.   . simvastatin (ZOCOR) 20 MG tablet Take 20 mg by mouth daily.     No facility-administered encounter medications on file as of 04/17/2017.     Functional Status:  In your present state of health, do you have any difficulty performing the following activities: 02/07/2017 02/02/2017  Hearing? N N  Vision? N N  Difficulty concentrating or making decisions? Malvin Johns  Walking or climbing stairs? N N  Dressing or bathing? N N  Doing  errands, shopping? Malvin Johns  Preparing Food and eating ? N -  Using the Toilet? N -  In the past six months, have you accidently leaked urine? N -  Do you have problems with loss of bowel control? N -  Managing your Medications? Y -  Managing your Finances? Y -  Housekeeping or managing your Housekeeping? N -  Some recent data might be hidden    Fall/Depression Screening:  PHQ 2/9 Scores 04/17/2017 02/07/2017  PHQ - 2 Score 0 0    Assessment:  Patient stated that her blood pressure readings have been elevated since her doctor changed her blood pressure medicine was changed a few months ago. She reported that her granddaughter, Doreatha Martin is working with the doctor to try to get them better controlled. She is keeping a daily log of all of her readings, including her fasting blood sugars. In the past month, her blood sugars have ranged between 101 and 230 (some of her blood sugar readings are non-fasting, but they are not separate from fasting ones.  Blood pressures for last week are noted above.  Patient reported that she remains active and is going to dance class once a week for exercise. She currently has no needs or additional concerns.   Plan:  RNCM  had planned to close case today, but will follow up within next month to evaluate blood pressure control.  TurkeyVictoria R. Bryker Fletchall, RN, BSN, CCM [facility] Care Management Coordinator [phone]

## 2017-04-18 DIAGNOSIS — M150 Primary generalized (osteo)arthritis: Secondary | ICD-10-CM | POA: Diagnosis not present

## 2017-04-18 DIAGNOSIS — E7800 Pure hypercholesterolemia, unspecified: Secondary | ICD-10-CM | POA: Diagnosis not present

## 2017-04-18 DIAGNOSIS — E1122 Type 2 diabetes mellitus with diabetic chronic kidney disease: Secondary | ICD-10-CM | POA: Diagnosis not present

## 2017-04-18 DIAGNOSIS — I10 Essential (primary) hypertension: Secondary | ICD-10-CM | POA: Diagnosis not present

## 2017-04-18 DIAGNOSIS — E1165 Type 2 diabetes mellitus with hyperglycemia: Secondary | ICD-10-CM | POA: Diagnosis not present

## 2017-04-18 DIAGNOSIS — E039 Hypothyroidism, unspecified: Secondary | ICD-10-CM | POA: Diagnosis not present

## 2017-04-18 DIAGNOSIS — E559 Vitamin D deficiency, unspecified: Secondary | ICD-10-CM | POA: Diagnosis not present

## 2017-04-22 DIAGNOSIS — E1122 Type 2 diabetes mellitus with diabetic chronic kidney disease: Secondary | ICD-10-CM | POA: Diagnosis not present

## 2017-04-22 DIAGNOSIS — E1142 Type 2 diabetes mellitus with diabetic polyneuropathy: Secondary | ICD-10-CM | POA: Diagnosis not present

## 2017-04-22 DIAGNOSIS — E119 Type 2 diabetes mellitus without complications: Secondary | ICD-10-CM | POA: Diagnosis not present

## 2017-05-14 ENCOUNTER — Other Ambulatory Visit: Payer: Self-pay

## 2017-05-14 NOTE — Patient Outreach (Signed)
[facility]) Care Management   05/14/2017  Shari Golden [birth year] [id number]  Shari MusselLaura L Robenson is an 83 y.o. female  Subjective: Home visit completed with patient. Patient's granddaughter, Shari Golden was present for visit. Patient reported she has been feeling well.  Objective:  Blood pressure (!) 162/54, pulse (!) 50, SpO2 97 %. ROS  Physical Exam  Encounter Medications:   Outpatient Encounter Prescriptions as of 05/14/2017  Medication Sig Note  . acetaminophen (TYLENOL) 325 MG tablet Take 650 mg by mouth every 6 (six) hours as needed.   Marland Kitchen. aspirin 81 MG chewable tablet Chew 81 mg by mouth daily.   Marland Kitchen. diltiazem (CARDIZEM CD) 360 MG 24 hr capsule Take 360 mg by mouth daily.   . insulin glargine (LANTUS) 100 UNIT/ML injection Inject 8 Units into the skin 4 (four) times daily as needed (sugar level - sliding scale if less than 150). Sliding scale 02/07/2017: Patient takes 5 units once a day if blood sugar is less than 150. If blood sugar is greater than 150, she takes 8 units   . Iron-Vitamin C (VITRON-C) 65-125 MG TABS Take 1 tablet by mouth daily.   . metoprolol succinate (TOPROL XL) 25 MG 24 hr tablet Take 1 tablet (25 mg total) by mouth daily.   . multivitamin (RENA-VIT) TABS tablet Take 1 tablet by mouth daily.     . pantoprazole (PROTONIX) 40 MG tablet Take 40 mg by mouth daily.     . sertraline (ZOLOFT) 50 MG tablet Take 50 mg by mouth daily.   . simvastatin (ZOCOR) 20 MG tablet Take 20 mg by mouth daily.     No facility-administered encounter medications on file as of 05/14/2017.     Functional Status:   In your present state of health, do you have any difficulty performing the following activities: 02/07/2017 02/02/2017  Hearing? N N  Vision? N N  Difficulty concentrating or making decisions? Shari JohnsY Y  Walking or climbing stairs? N N  Dressing or bathing? N N  Doing errands, shopping? Shari JohnsY Y  Preparing Food and eating ? N -  Using the Toilet? N -  In the past six  months, have you accidently leaked urine? N -  Do you have problems with loss of bowel control? N -  Managing your Medications? Y -  Managing your Finances? Y -  Housekeeping or managing your Housekeeping? N -  Some recent data might be hidden    Fall/Depression Screening:    Fall Risk  04/17/2017 02/07/2017  Falls in the past year? No No   PHQ 2/9 Scores 04/17/2017 02/07/2017  PHQ - 2 Score 0 0    Assessment:  Granddaughter is having to come every day to leave one day's medicine at a time.  Having difficulty getting in-home aide to check and document blood pressure. In-home aide comes from [facility] Edema noted  to bilateral lower extremities. Patient continues to remain active and participates in a dance class with her granddaughter once a week.  She denies any shortness of breath or chest pain. Patient blood pressure slightly elevated today. Her granddaughter stated she had a call in to doctor about her blood  Pressures because they are trending a little higher.  Plan: RNCM to follow up in a month with patient and she is agreeable.  TurkeyVictoria R. Aireona Torelli, RN, BSN, CCM [facility] Care Management Coordinator [phone]

## 2017-05-17 DIAGNOSIS — E1122 Type 2 diabetes mellitus with diabetic chronic kidney disease: Secondary | ICD-10-CM | POA: Diagnosis not present

## 2017-06-14 ENCOUNTER — Other Ambulatory Visit: Payer: Self-pay

## 2017-06-14 NOTE — Patient Outreach (Signed)
[facility]) Care Management  06/14/17  Lottie MusselLaura L Rossie 11/25/1930 [id number]  Successful contact completed with patient's granddaughter, Simmie DaviesSamantha Boyd, patient information verified. Per [name], there are no changes. Patient continues to live in independent living with in-home aides. She voiced frustrations that the aides were not checking her blood pressures as they were supposed to. She stated that she plans to follow up with in-home aide agency regarding this.   She currently does not have any other questions or concerns. She stated patient has bene doing ok.  Plan: Home visit to be completed within the next month to follow up regarding blood pressure readings. In-home aides are there with patient until around 2 pm.  TurkeyVictoria R. Haniah Penny, RN, BSN, CCM [facility] Care Management Coordinator [phone]

## 2017-06-18 DIAGNOSIS — E1122 Type 2 diabetes mellitus with diabetic chronic kidney disease: Secondary | ICD-10-CM | POA: Diagnosis not present

## 2017-07-18 DIAGNOSIS — E1122 Type 2 diabetes mellitus with diabetic chronic kidney disease: Secondary | ICD-10-CM | POA: Diagnosis not present

## 2017-08-02 DIAGNOSIS — E1122 Type 2 diabetes mellitus with diabetic chronic kidney disease: Secondary | ICD-10-CM | POA: Diagnosis not present

## 2017-08-02 DIAGNOSIS — E119 Type 2 diabetes mellitus without complications: Secondary | ICD-10-CM | POA: Diagnosis not present

## 2017-08-02 DIAGNOSIS — E1142 Type 2 diabetes mellitus with diabetic polyneuropathy: Secondary | ICD-10-CM | POA: Diagnosis not present

## 2017-08-19 DIAGNOSIS — E1122 Type 2 diabetes mellitus with diabetic chronic kidney disease: Secondary | ICD-10-CM | POA: Diagnosis not present

## 2017-08-21 ENCOUNTER — Other Ambulatory Visit: Payer: Self-pay

## 2017-08-21 NOTE — Patient Outreach (Signed)
[facility] Care Management   08/21/2017  Shari Golden 02-Aug-1930 [id number]   Home visit completed with patient. Her in-home aide was also present.  Shari Golden is an 83 y.o. female  Subjective:   Objective:  Blood pressure (!) 160/70, pulse 72, resp. rate 18, SpO2 96 %. ROS  Physical Exam  Respirations even and unlabored with breath sounds clear to auscultation.  Regular heart rate and rhythm Patient alert and oriented, but gets confused at time.  Edema noted to bilateral lower extremities  Encounter Medications:   Outpatient Encounter Prescriptions as of 08/21/2017  Medication Sig Note  . acetaminophen (TYLENOL) 325 MG tablet Take 650 mg by mouth every 6 (six) hours as needed.   Marland Kitchen. aspirin 81 MG chewable tablet Chew 81 mg by mouth daily.   Marland Kitchen. diltiazem (CARDIZEM CD) 360 MG 24 hr capsule Take 360 mg by mouth daily.   . insulin glargine (LANTUS) 100 UNIT/ML injection Inject 8 Units into the skin 4 (four) times daily as needed (sugar level - sliding scale if less than 150). Sliding scale 02/07/2017: Patient takes 5 units once a day if blood sugar is less than 150. If blood sugar is greater than 150, she takes 8 units   . Iron-Vitamin C (VITRON-C) 65-125 MG TABS Take 1 tablet by mouth daily.   . metoprolol succinate (TOPROL XL) 25 MG 24 hr tablet Take 1 tablet (25 mg total) by mouth daily.   . multivitamin (RENA-VIT) TABS tablet Take 1 tablet by mouth daily.     . pantoprazole (PROTONIX) 40 MG tablet Take 40 mg by mouth daily.     . sertraline (ZOLOFT) 50 MG tablet Take 50 mg by mouth daily.   . simvastatin (ZOCOR) 20 MG tablet Take 20 mg by mouth daily.     No facility-administered encounter medications on file as of 08/21/2017.     Functional Status:   In your present state of health, do you have any difficulty performing the following activities: 02/07/2017 02/02/2017  Hearing? N N  Vision? N N  Difficulty concentrating or making decisions? Malvin JohnsY Y  Walking or  climbing stairs? N N  Dressing or bathing? N N  Doing errands, shopping? Malvin JohnsY Y  Preparing Food and eating ? N -  Using the Toilet? N -  In the past six months, have you accidently leaked urine? N -  Do you have problems with loss of bowel control? N -  Managing your Medications? Y -  Managing your Finances? Y -  Housekeeping or managing your Housekeeping? N -  Some recent data might be hidden    Fall/Depression Screening:    Fall Risk  05/14/2017 04/17/2017 02/07/2017  Falls in the past year? No No No   PHQ 2/9 Scores 04/17/2017 02/07/2017  PHQ - 2 Score 0 0    Assessment:   Patient has been doing well. Blood pressures remain the same and are unchanged. Patient denies any complaints of pain or headaches, although her in-home aide stated that she does have an occasional headache, but is unable to state if her blood pressure is elevated on those days. Patient's granddaughter stops in daily to check in with patient and makes sure she is taking her medications accurately. She fills a weekly pillbox, but only leaves out one day at a time in a small pill container so that patient does not take too much. Patient has no questions or concerns and no longer has any case management needs.  Plan: RNCM to perform case closure and patient is aware and agreeable. She was given contact information for [facility] and encouraged to call with any questions/concerns.  Turkey R. Jennetta Flood, RN, BSN, CCM [facility] Care Management Coordinator [phone]

## 2017-09-17 DIAGNOSIS — E1122 Type 2 diabetes mellitus with diabetic chronic kidney disease: Secondary | ICD-10-CM | POA: Diagnosis not present

## 2017-10-18 DIAGNOSIS — E1122 Type 2 diabetes mellitus with diabetic chronic kidney disease: Secondary | ICD-10-CM | POA: Diagnosis not present

## 2017-10-30 DIAGNOSIS — E1142 Type 2 diabetes mellitus with diabetic polyneuropathy: Secondary | ICD-10-CM | POA: Diagnosis not present

## 2017-10-30 DIAGNOSIS — E119 Type 2 diabetes mellitus without complications: Secondary | ICD-10-CM | POA: Diagnosis not present

## 2017-10-30 DIAGNOSIS — E1122 Type 2 diabetes mellitus with diabetic chronic kidney disease: Secondary | ICD-10-CM | POA: Diagnosis not present

## 2017-11-19 DIAGNOSIS — E1122 Type 2 diabetes mellitus with diabetic chronic kidney disease: Secondary | ICD-10-CM | POA: Diagnosis not present

## 2017-12-16 DIAGNOSIS — E1122 Type 2 diabetes mellitus with diabetic chronic kidney disease: Secondary | ICD-10-CM | POA: Diagnosis not present

## 2018-01-14 DIAGNOSIS — E1122 Type 2 diabetes mellitus with diabetic chronic kidney disease: Secondary | ICD-10-CM | POA: Diagnosis not present

## 2018-01-30 DIAGNOSIS — E1142 Type 2 diabetes mellitus with diabetic polyneuropathy: Secondary | ICD-10-CM | POA: Diagnosis not present

## 2018-01-30 DIAGNOSIS — E1122 Type 2 diabetes mellitus with diabetic chronic kidney disease: Secondary | ICD-10-CM | POA: Diagnosis not present

## 2018-01-30 DIAGNOSIS — E119 Type 2 diabetes mellitus without complications: Secondary | ICD-10-CM | POA: Diagnosis not present

## 2018-02-14 DIAGNOSIS — E1122 Type 2 diabetes mellitus with diabetic chronic kidney disease: Secondary | ICD-10-CM | POA: Diagnosis not present

## 2018-03-14 DIAGNOSIS — E782 Mixed hyperlipidemia: Secondary | ICD-10-CM | POA: Diagnosis not present

## 2018-03-14 DIAGNOSIS — I10 Essential (primary) hypertension: Secondary | ICD-10-CM | POA: Diagnosis not present

## 2018-03-14 DIAGNOSIS — E1142 Type 2 diabetes mellitus with diabetic polyneuropathy: Secondary | ICD-10-CM | POA: Diagnosis not present

## 2018-03-14 DIAGNOSIS — Z1389 Encounter for screening for other disorder: Secondary | ICD-10-CM | POA: Diagnosis not present

## 2018-03-14 DIAGNOSIS — G301 Alzheimer's disease with late onset: Secondary | ICD-10-CM | POA: Diagnosis not present

## 2018-03-14 DIAGNOSIS — E1122 Type 2 diabetes mellitus with diabetic chronic kidney disease: Secondary | ICD-10-CM | POA: Diagnosis not present

## 2018-03-14 DIAGNOSIS — Z1331 Encounter for screening for depression: Secondary | ICD-10-CM | POA: Diagnosis not present

## 2018-03-19 DIAGNOSIS — E1122 Type 2 diabetes mellitus with diabetic chronic kidney disease: Secondary | ICD-10-CM | POA: Diagnosis not present

## 2018-03-21 DIAGNOSIS — E875 Hyperkalemia: Secondary | ICD-10-CM | POA: Diagnosis not present

## 2018-03-28 ENCOUNTER — Other Ambulatory Visit: Payer: Self-pay | Admitting: Internal Medicine

## 2018-03-28 NOTE — Telephone Encounter (Signed)
Rx request sent to pharmacy.  

## 2018-04-18 DIAGNOSIS — E1122 Type 2 diabetes mellitus with diabetic chronic kidney disease: Secondary | ICD-10-CM | POA: Diagnosis not present

## 2018-04-29 ENCOUNTER — Encounter (HOSPITAL_COMMUNITY): Payer: Self-pay | Admitting: Emergency Medicine

## 2018-04-29 ENCOUNTER — Emergency Department (HOSPITAL_COMMUNITY)
Admission: EM | Admit: 2018-04-29 | Discharge: 2018-04-29 | Disposition: A | Discharge status: Home / Self Care | Payer: Medicare Other | Attending: Emergency Medicine | Admitting: Emergency Medicine

## 2018-04-29 ENCOUNTER — Emergency Department (HOSPITAL_COMMUNITY): Payer: Medicare Other

## 2018-04-29 DIAGNOSIS — Z91040 Latex allergy status: Secondary | ICD-10-CM | POA: Insufficient documentation

## 2018-04-29 DIAGNOSIS — R638 Other symptoms and signs concerning food and fluid intake: Secondary | ICD-10-CM | POA: Insufficient documentation

## 2018-04-29 DIAGNOSIS — Z794 Long term (current) use of insulin: Secondary | ICD-10-CM | POA: Diagnosis not present

## 2018-04-29 DIAGNOSIS — Z87891 Personal history of nicotine dependence: Secondary | ICD-10-CM | POA: Insufficient documentation

## 2018-04-29 DIAGNOSIS — E1140 Type 2 diabetes mellitus with diabetic neuropathy, unspecified: Secondary | ICD-10-CM | POA: Diagnosis not present

## 2018-04-29 DIAGNOSIS — R634 Abnormal weight loss: Secondary | ICD-10-CM | POA: Insufficient documentation

## 2018-04-29 DIAGNOSIS — R531 Weakness: Secondary | ICD-10-CM | POA: Insufficient documentation

## 2018-04-29 DIAGNOSIS — Z79899 Other long term (current) drug therapy: Secondary | ICD-10-CM | POA: Insufficient documentation

## 2018-04-29 DIAGNOSIS — S299XXA Unspecified injury of thorax, initial encounter: Secondary | ICD-10-CM | POA: Diagnosis not present

## 2018-04-29 DIAGNOSIS — I129 Hypertensive chronic kidney disease with stage 1 through stage 4 chronic kidney disease, or unspecified chronic kidney disease: Secondary | ICD-10-CM | POA: Insufficient documentation

## 2018-04-29 DIAGNOSIS — N390 Urinary tract infection, site not specified: Secondary | ICD-10-CM | POA: Insufficient documentation

## 2018-04-29 DIAGNOSIS — M5136 Other intervertebral disc degeneration, lumbar region: Secondary | ICD-10-CM | POA: Diagnosis not present

## 2018-04-29 DIAGNOSIS — Z7982 Long term (current) use of aspirin: Secondary | ICD-10-CM | POA: Diagnosis not present

## 2018-04-29 DIAGNOSIS — M479 Spondylosis, unspecified: Secondary | ICD-10-CM | POA: Diagnosis not present

## 2018-04-29 DIAGNOSIS — M549 Dorsalgia, unspecified: Secondary | ICD-10-CM | POA: Diagnosis present

## 2018-04-29 DIAGNOSIS — N183 Chronic kidney disease, stage 3 (moderate): Secondary | ICD-10-CM | POA: Diagnosis not present

## 2018-04-29 DIAGNOSIS — E1122 Type 2 diabetes mellitus with diabetic chronic kidney disease: Secondary | ICD-10-CM | POA: Insufficient documentation

## 2018-04-29 DIAGNOSIS — M546 Pain in thoracic spine: Secondary | ICD-10-CM | POA: Diagnosis not present

## 2018-04-29 DIAGNOSIS — M545 Low back pain: Secondary | ICD-10-CM | POA: Diagnosis not present

## 2018-04-29 DIAGNOSIS — R001 Bradycardia, unspecified: Secondary | ICD-10-CM | POA: Diagnosis not present

## 2018-04-29 LAB — URINALYSIS, ROUTINE W REFLEX MICROSCOPIC
BILIRUBIN URINE: NEGATIVE
Glucose, UA: 150 mg/dL — AB
Ketones, ur: NEGATIVE mg/dL
Nitrite: NEGATIVE
PROTEIN: 30 mg/dL — AB
SPECIFIC GRAVITY, URINE: 1.012 (ref 1.005–1.030)
pH: 5 (ref 5.0–8.0)

## 2018-04-29 LAB — CBC WITH DIFFERENTIAL/PLATELET
Basophils Absolute: 0 10*3/uL (ref 0.0–0.1)
Basophils Relative: 0 %
Eosinophils Absolute: 0 10*3/uL (ref 0.0–0.7)
Eosinophils Relative: 0 %
HCT: 30.8 % — ABNORMAL LOW (ref 36.0–46.0)
HEMOGLOBIN: 10.4 g/dL — AB (ref 12.0–15.0)
LYMPHS ABS: 1.1 10*3/uL (ref 0.7–4.0)
LYMPHS PCT: 15 %
MCH: 30.8 pg (ref 26.0–34.0)
MCHC: 33.8 g/dL (ref 30.0–36.0)
MCV: 91.1 fL (ref 78.0–100.0)
MONOS PCT: 2 %
Monocytes Absolute: 0.2 10*3/uL (ref 0.1–1.0)
NEUTROS PCT: 83 %
Neutro Abs: 6.4 10*3/uL (ref 1.7–7.7)
Platelets: 192 10*3/uL (ref 150–400)
RBC: 3.38 MIL/uL — ABNORMAL LOW (ref 3.87–5.11)
RDW: 12.9 % (ref 11.5–15.5)
WBC: 7.8 10*3/uL (ref 4.0–10.5)

## 2018-04-29 LAB — COMPREHENSIVE METABOLIC PANEL
ALT: 20 U/L (ref 14–54)
AST: 29 U/L (ref 15–41)
Albumin: 4.2 g/dL (ref 3.5–5.0)
Alkaline Phosphatase: 75 U/L (ref 38–126)
Anion gap: 11 (ref 5–15)
BUN: 39 mg/dL — ABNORMAL HIGH (ref 6–20)
CHLORIDE: 102 mmol/L (ref 101–111)
CO2: 21 mmol/L — AB (ref 22–32)
Calcium: 9.2 mg/dL (ref 8.9–10.3)
Creatinine, Ser: 2.45 mg/dL — ABNORMAL HIGH (ref 0.44–1.00)
GFR calc non Af Amer: 17 mL/min — ABNORMAL LOW (ref >60)
GFR, EST AFRICAN AMERICAN: 19 mL/min — AB (ref >60)
Glucose, Bld: 263 mg/dL — ABNORMAL HIGH (ref 65–99)
Potassium: 4.8 mmol/L (ref 3.5–5.1)
Sodium: 134 mmol/L — ABNORMAL LOW (ref 135–145)
Total Bilirubin: 0.3 mg/dL (ref 0.3–1.2)
Total Protein: 7.8 g/dL (ref 6.5–8.1)

## 2018-04-29 MED ORDER — TRAMADOL-ACETAMINOPHEN 37.5-325 MG PO TABS
1.0000 | ORAL_TABLET | Freq: Once | ORAL | Status: AC
  Administered 2018-04-29: 1 via ORAL

## 2018-04-29 MED ORDER — TRAMADOL-ACETAMINOPHEN 37.5-325 MG PO TABS: 1.0000 | ORAL_TABLET | Freq: Four times a day (QID) | ORAL | 0 refills | Status: DC | PRN

## 2018-04-29 MED ORDER — SULFAMETHOXAZOLE-TRIMETHOPRIM 800-160 MG PO TABS
1.0000 | ORAL_TABLET | Freq: Once | ORAL | Status: AC
Start: 2018-04-29 — End: 2018-04-29
  Administered 2018-04-29: 1 via ORAL
  Filled 2018-04-29: qty 1

## 2018-04-29 NOTE — ED Provider Notes (Signed)
[facility]-EMERGENCY DEPT Provider Note   CSN: [id number] Arrival date & time: 04/29/18  1353     History   Chief Complaint Chief Complaint  Patient presents with  . Back Pain  . Bradycardia    HPI Shari Golden is a 84 y.o. female.  HPI   She presents for evaluation of back pain associated with decreased oral intake, less activity and generalized weakness.  Granddaughter who is her power of attorney is with her and states that she is worried that her grandmother has lost a lot of weight.  She recalls that the last time her grandmother saw her PCP, in April 2019, her weight was 120 pounds.  Patient lives with her daughter, and the granddaughter sees her just about every day.  The patient is a poor historian and most of the history comes from the granddaughter however the patient agrees with the history given.  There is been no recent fever, cough, nausea, vomiting, focal weakness or paresthesia.  She is taking all of her usual medications.  When she saw her PCP in April, he asked her to stop taking insulin but because of high blood sugars, she is still taking the insulin.  She has not seen her cardiologist recently.  There are no other known modifying factors.  Past Medical History:  Diagnosis Date  . Anemia   . Anxiety   . Arthritis   . Carotid artery disease (HCC)    a. 12/2015 Carotid u/s: 1-39% bilat ICA stenosis-->f/u prn.  . CKD (chronic kidney disease), stage III (HCC)    Dr. Fausto Skillern  . Constipation   . Essential hypertension   . GERD (gastroesophageal reflux disease)   . History of cardiac catheterization    a. 2004 Cath: Normal coronaries.  Marland Kitchen PAF (paroxysmal atrial fibrillation) (HCC)    a. Remotely documented without recurrence.  . Peripheral neuropathy   . Type II diabetes mellitus (HCC)    a. 01/2017 HbA1c 7.0.    Patient Active Problem List   Diagnosis Date Noted  . Controlled diabetes mellitus type 2 with complications (HCC)   .  Accidental overdose   . Bradycardia 01/31/2017  . Essential hypertension 01/31/2017  . Anemia 01/31/2017  . Hyperlipidemia 01/31/2017  . Hyperkalemia 01/31/2017  . Burning sensation-right arm/wrist 11/17/2013  . Weakness of wrist-Right, and arm 11/17/2013  . Occlusion and stenosis of carotid artery without mention of cerebral infarction 11/04/2012  . Type II diabetes mellitus (HCC) 10/24/2011  . Hypertension   . CKD (chronic kidney disease), stage III (HCC)   . GERD (gastroesophageal reflux disease)   . Peripheral neuropathy   . Chest tightness   . Atrial fibrillation (HCC)   . Ejection fraction   . Mitral regurgitation   . Atrial septal aneurysm   . Edema   . Anxiety   . Carotid artery disease [facility])     Past Surgical History:  Procedure Laterality Date  . ABDOMINAL HYSTERECTOMY    . APPENDECTOMY    . CARPAL TUNNEL RELEASE Bilateral   . LUMBAR LAMINECTOMY  ~ 2011  . SHOULDER ARTHROSCOPY W/ ROTATOR CUFF REPAIR Left   . TONSILLECTOMY  2012  . WRIST FUSION Right 04/01/2013   Procedure: RIGHT WRIST ARTHRODESIS WITH POSSIBLE LOCAL BONE GRAFT;  Surgeon: Sharma Covert, MD;  Location: [facility] OR;  Service: Orthopedics;  Laterality: Right;     OB History   None      Home Medications    Prior to Admission medications  Medication Sig Start Date End Date Taking? Authorizing Provider  acetaminophen (TYLENOL) 325 MG tablet Take 650 mg by mouth every 6 (six) hours as needed.   Yes [provider]  diltiazem (CARDIZEM CD) 360 MG 24 hr capsule Take 360 mg by mouth daily.   Yes [provider]  insulin glargine (LANTUS) 100 UNIT/ML injection Inject 8 Units into the skin daily. Sliding scale   Yes [provider]  metoprolol succinate (TOPROL-XL) 25 MG 24 hr tablet TAKE 1 TABLET BY MOUTH ONCE DAILY 03/28/18  Yes Chilton Si, MD  multivitamin (RENA-VIT) TABS tablet Take 1 tablet by mouth daily.     Yes [provider]  pantoprazole (PROTONIX) 40 MG  tablet Take 40 mg by mouth daily.     Yes [provider]  sertraline (ZOLOFT) 100 MG tablet Take 100 mg by mouth daily.  02/19/17  Yes Richardean Chimera, MD  aspirin 81 MG chewable tablet Chew 81 mg by mouth daily.    [provider]  traMADol-acetaminophen (ULTRACET) 37.5-325 MG tablet Take 1 tablet by mouth every 6 (six) hours as needed. 04/29/18   Mancel Bale, MD    Family History Family History  Problem Relation Age of Onset  . Diabetes Sister   . Heart disease Sister   . Prostate cancer Father   . Stroke Neg Hx   . Coronary artery disease Neg Hx     Social History Social History   Tobacco Use  . Smoking status: Never Smoker  . Smokeless tobacco: Former Neurosurgeon    Types: Snuff  . Tobacco comment: 04/01/2013 "smoked 3 cigarettes in 1956; stopped; quit dipping > 20 yr ago"  Substance Use Topics  . Alcohol use: No    Alcohol/week: 0.0 oz  . Drug use: No     Allergies   Erythromycin; Iodine; Iohexol; Latex; and Penicillins   Review of Systems Review of Systems  All other systems reviewed and are negative.    Physical Exam Updated Vital Signs BP 138/66   Pulse 69   Temp (!) 97.4 F (36.3 C) (Oral)   Resp 15   Wt 63.5 kg (140 lb)   SpO2 98%   BMI 22.60 kg/m   Physical Exam  Constitutional: She appears well-developed.  Frail elderly female  HENT:  Head: Normocephalic and atraumatic.  Right Ear: External ear normal.  Left Ear: External ear normal.  Eyes: Pupils are equal, round, and reactive to light. Conjunctivae and EOM are normal.  Neck: Normal range of motion and phonation normal. Neck supple.  Cardiovascular: Normal rate, regular rhythm and normal heart sounds.  Pulmonary/Chest: Effort normal and breath sounds normal. No stridor. No respiratory distress. She has no wheezes. She exhibits no bony tenderness.  Abdominal: Soft. There is no tenderness. There is no guarding.  Musculoskeletal: Normal range of motion. She exhibits no edema or  deformity.  Mild kyphosis of the thoracic spine.  Diffuse tenderness of the back without focal tenderness or palpable step-off of the spine.  Neurological: She is alert. No cranial nerve deficit or sensory deficit. She exhibits normal muscle tone. Coordination normal.  Skin: Skin is warm, dry and intact.  Psychiatric: She has a normal mood and affect. Her behavior is normal.  Nursing note and vitals reviewed.    ED Treatments / Results  Labs (all labs ordered are listed, but only abnormal results are displayed) Labs Reviewed  COMPREHENSIVE METABOLIC PANEL - Abnormal; Notable for the following components:      Result  Value   Sodium 134 (*)    CO2 21 (*)    Glucose, Bld 263 (*)    BUN 39 (*)    Creatinine, Ser 2.45 (*)    GFR calc non Af Amer 17 (*)    GFR calc Af Amer 19 (*)    All other components within normal limits  CBC WITH DIFFERENTIAL/PLATELET - Abnormal; Notable for the following components:   RBC 3.38 (*)    Hemoglobin 10.4 (*)    HCT 30.8 (*)    All other components within normal limits  URINALYSIS, ROUTINE W REFLEX MICROSCOPIC - Abnormal; Notable for the following components:   APPearance CLOUDY (*)    Glucose, UA 150 (*)    Hgb urine dipstick SMALL (*)    Protein, ur 30 (*)    Leukocytes, UA LARGE (*)    WBC, UA >50 (*)    Bacteria, UA MANY (*)    All other components within normal limits    EKG EKG Interpretation  Date/Time:  Tuesday Apr 29 2018 15:22:38 EDT Ventricular Rate:  36 PR Interval:  164 QRS Duration: 94 QT Interval:  478 QTC Calculation: 369 R Axis:   3 Text Interpretation:  Marked sinus bradycardia with Premature atrial complexes in a pattern of bigeminy Septal infarct , age undetermined Abnormal ECG Since last tracing now in sinus rhythym Confirmed by Mancel Bale [phone]) on 04/29/2018 4:00:52 PM   Radiology Dg Chest 2 View  Result Date: 04/29/2018 CLINICAL DATA:  Low back pain since a fall yesterday, history of dementia EXAM: CHEST - 2  VIEW COMPARISON:  Portable chest x-ray of 01/31/2017 FINDINGS: The lungs appear relatively well aerated. No pneumonia or effusion is seen. Opacities overlie the anterior lateral right second rib, right third rib, right fifth rib. This may represent callus surrounding healed or healing rib fractures. Mediastinal and hilar contours are unremarkable. The heart is mildly enlarged and stable. There are degenerative changes in the mid to lower thoracic spine. Moderate thoracic aortic atherosclerosis is present. IMPRESSION: 1. No pneumonia or effusion. 2. Opacities overlying the right lung probably represent callus of healed or healing anterior right rib fractures. Electronically Signed   By: Dwyane Dee M.D.   On: 04/29/2018 17:10   Dg Thoracic Spine 2 View  Result Date: 04/29/2018 CLINICAL DATA:  Low back pain 1 day, fell, history of dementia EXAM: THORACIC SPINE 2 VIEWS COMPARISON:  Chest x-ray of 01/31/2017 FINDINGS: The thoracic vertebrae are in normal alignment. No compression deformity is seen. There is degenerative spurring throughout the thoracic spine although intervertebral disc spaces appear relatively well preserved. No paravertebral soft tissue prominence is noted. IMPRESSION: Diffuse degenerative change.  No acute compression deformity. Electronically Signed   By: Dwyane Dee M.D.   On: 04/29/2018 17:07   Dg Lumbar Spine Complete  Result Date: 04/29/2018 CLINICAL DATA:  Low back pain, recent fall, history of dementia EXAM: LUMBAR SPINE - COMPLETE 4+ VIEW COMPARISON:  CT lumbar spine of 01/05/2010 the FINDINGS: There is 8 mm anterolisthesis of L5 on S1 which appears to have been present previously and most likely is due to degenerative change of the facet joints. Otherwise the lumbar vertebrae are in normal alignment. There is diffuse degenerative disc disease with loss of intervertebral disc spaces, sclerosis, and vacuum disc phenomenon. No compression deformity is seen. The SI joints appear  corticated. The bones appear diffusely osteopenic. IMPRESSION: 1. No compression deformity.  Osteopenia. 2. Diffuse degenerative disc disease. 3. Stable anterolisthesis of L5  on S1 by approximately 8 mm. Electronically Signed   By: Dwyane Dee M.D.   On: 04/29/2018 17:13    Procedures Procedures (including critical care time)  Medications Ordered in ED Medications  traMADol-acetaminophen (ULTRACET) 37.5-325 MG per tablet 1 tablet (1 tablet Oral Given 04/29/18 2014)  sulfamethoxazole-trimethoprim (BACTRIM DS,SEPTRA DS) 800-160 MG per tablet 1 tablet (1 tablet Oral Given 04/29/18 2014)     Initial Impression / Assessment and Plan / ED Course  I have reviewed the triage vital signs and the nursing notes.  Pertinent labs & imaging results that were available during my care of the patient were reviewed by me and considered in my medical decision making (see chart for details).  Clinical Course as of Apr 30 2339  Tue Apr 29, 2018  1834 Normal except hemoglobin low  CBC with Differential(!) [EW]  1835 Normal except sodium low, CO2 low, glucose high, BUN high, creatinine high  Comprehensive metabolic panel(!) [EW]  1835 DJD, images reviewed  DG Lumbar Spine Complete [EW]  1835 No infiltrate, images reviewed  DG Chest 2 View [EW]  1836 DJD, images reviewed  DG Thoracic Spine 2 View [EW]    Clinical Course User Index [EW] Mancel Bale, MD    Patient Vitals for the past 24 hrs:  BP Temp Temp src Pulse Resp SpO2 Weight  04/29/18 1930 138/66 - - 69 15 98 % -  04/29/18 1839 136/85 - - 61 17 100 % -  04/29/18 1800 (!) 162/87 - - 65 14 99 % -  04/29/18 1733 (!) 150/68 - - (!) 59 13 99 % -  04/29/18 1708 - - - (!) 56 12 100 % -  04/29/18 1639 - - - - - - 63.5 kg (140 lb)  04/29/18 1630 124/67 - - (!) 50 (!) 21 100 % -  04/29/18 1615 - - - (!) 46 11 100 % -  04/29/18 1601 130/84 - - (!) 52 16 99 % -  04/29/18 1600 130/84 - - - 17 - -  04/29/18 1536 (!) 128/53 - - (!) 32 19 100 % -    04/29/18 1535 - - - (!) 41 (!) 22 100 % -  04/29/18 1534 (!) 128/53 - - - 17 - -  04/29/18 1533 - - - (!) 35 - - -  04/29/18 1430 (!) 120/44 (!) 97.4 F (36.3 C) Oral 64 16 100 % -    At discharge- reevaluation with update and discussion. After initial assessment and treatment, an updated evaluation reveals she is comfortable has no further complaints.  Findings discussed with patient's daughter and all questions answered. Mancel Bale   Medical Decision Making: Nonspecific back pain, with degenerative joint disease of thoracic and lumbar spine.  Possible UTI causing back pain as well.  Doubt lumbar myelopathy, serious bacterial infection or metabolic instability.  CRITICAL CARE-no Performed by: Mancel Bale    Nursing Notes Reviewed/ Care Coordinated Applicable Imaging Reviewed Interpretation of Laboratory Data incorporated into ED treatment  The patient appears reasonably screened and/or stabilized for discharge and I doubt any other medical condition or other [facility] requiring further screening, evaluation, or treatment in the ED at this time prior to discharge.  Plan: Home Medications-continue usual medications,  Tylenol for pain.; Home Treatments-rest, heat to affected area; return here if the recommended treatment, does not improve the symptoms; Recommended follow up-PCP, as needed   Final Clinical Impressions(s) / ED Diagnoses   Final diagnoses:  Degenerative joint disease of low back  Urinary tract infection without hematuria, site unspecified    ED Discharge Orders        Ordered    traMADol-acetaminophen (ULTRACET) 37.5-325 MG tablet  Every 6 hours PRN     04/29/18 1956       Rx for Septra DS, #13 called in to her Pharmacy ([facility], [address])by me.   Mancel Bale, MD 04/30/18 1438

## 2018-04-29 NOTE — Discharge Instructions (Addendum)
The back pain is most likely related to spine disease, degenerative.  You might be having some back pain from a urinary tract infection as well.  The safest medication to treat back pain is Tylenol.  You can also try using heat on the sore area 3 or 4 times a day.  We are prescribing a pain medicine to use instead of Tylenol if needed for times when you have severe pain.  It is important to follow-up with your doctor for further evaluation and treatment in a week or so.  You can return here, if needed, for problems.

## 2018-04-29 NOTE — ED Triage Notes (Signed)
Per pt/family-states complaining of lower back pain since yesterday-no sure if she fell or injured it-patient has a history of dementia

## 2018-04-29 NOTE — ED Notes (Signed)
Attempted to in and out cath pt. No urine output. Did bladder scanner, pt has 250cc in bladder. Pt's family asking for a short break before cathing again.

## 2018-05-02 DIAGNOSIS — E1122 Type 2 diabetes mellitus with diabetic chronic kidney disease: Secondary | ICD-10-CM | POA: Diagnosis not present

## 2018-05-02 DIAGNOSIS — E1142 Type 2 diabetes mellitus with diabetic polyneuropathy: Secondary | ICD-10-CM | POA: Diagnosis not present

## 2018-05-02 DIAGNOSIS — E119 Type 2 diabetes mellitus without complications: Secondary | ICD-10-CM | POA: Diagnosis not present

## 2018-05-11 ENCOUNTER — Inpatient Hospital Stay (HOSPITAL_COMMUNITY)
Admission: EM | Admit: 2018-05-11 | Discharge: 2018-05-14 | DRG: 070 | Disposition: A | Discharge status: Hospice | Payer: Medicare Other | Attending: Internal Medicine | Admitting: Internal Medicine

## 2018-05-11 ENCOUNTER — Emergency Department (HOSPITAL_COMMUNITY): Payer: Medicare Other

## 2018-05-11 ENCOUNTER — Other Ambulatory Visit: Payer: Self-pay

## 2018-05-11 ENCOUNTER — Encounter (HOSPITAL_COMMUNITY): Payer: Self-pay | Admitting: Internal Medicine

## 2018-05-11 DIAGNOSIS — E1122 Type 2 diabetes mellitus with diabetic chronic kidney disease: Secondary | ICD-10-CM | POA: Diagnosis not present

## 2018-05-11 DIAGNOSIS — G9340 Encephalopathy, unspecified: Secondary | ICD-10-CM | POA: Diagnosis present

## 2018-05-11 DIAGNOSIS — D62 Acute posthemorrhagic anemia: Secondary | ICD-10-CM | POA: Diagnosis present

## 2018-05-11 DIAGNOSIS — Z794 Long term (current) use of insulin: Secondary | ICD-10-CM | POA: Diagnosis not present

## 2018-05-11 DIAGNOSIS — Z888 Allergy status to other drugs, medicaments and biological substances status: Secondary | ICD-10-CM

## 2018-05-11 DIAGNOSIS — Z8249 Family history of ischemic heart disease and other diseases of the circulatory system: Secondary | ICD-10-CM | POA: Diagnosis not present

## 2018-05-11 DIAGNOSIS — E042 Nontoxic multinodular goiter: Secondary | ICD-10-CM | POA: Diagnosis present

## 2018-05-11 DIAGNOSIS — W07XXXA Fall from chair, initial encounter: Secondary | ICD-10-CM | POA: Diagnosis present

## 2018-05-11 DIAGNOSIS — Z8042 Family history of malignant neoplasm of prostate: Secondary | ICD-10-CM

## 2018-05-11 DIAGNOSIS — K219 Gastro-esophageal reflux disease without esophagitis: Secondary | ICD-10-CM | POA: Diagnosis not present

## 2018-05-11 DIAGNOSIS — Z515 Encounter for palliative care: Secondary | ICD-10-CM | POA: Diagnosis not present

## 2018-05-11 DIAGNOSIS — S199XXA Unspecified injury of neck, initial encounter: Secondary | ICD-10-CM | POA: Diagnosis not present

## 2018-05-11 DIAGNOSIS — F0280 Dementia in other diseases classified elsewhere without behavioral disturbance: Secondary | ICD-10-CM | POA: Diagnosis present

## 2018-05-11 DIAGNOSIS — N183 Chronic kidney disease, stage 3 unspecified: Secondary | ICD-10-CM | POA: Diagnosis present

## 2018-05-11 DIAGNOSIS — Z66 Do not resuscitate: Secondary | ICD-10-CM | POA: Diagnosis present

## 2018-05-11 DIAGNOSIS — R339 Retention of urine, unspecified: Secondary | ICD-10-CM | POA: Diagnosis present

## 2018-05-11 DIAGNOSIS — E11649 Type 2 diabetes mellitus with hypoglycemia without coma: Secondary | ICD-10-CM | POA: Diagnosis not present

## 2018-05-11 DIAGNOSIS — I480 Paroxysmal atrial fibrillation: Secondary | ICD-10-CM | POA: Diagnosis not present

## 2018-05-11 DIAGNOSIS — Z981 Arthrodesis status: Secondary | ICD-10-CM

## 2018-05-11 DIAGNOSIS — E875 Hyperkalemia: Secondary | ICD-10-CM | POA: Diagnosis not present

## 2018-05-11 DIAGNOSIS — E43 Unspecified severe protein-calorie malnutrition: Secondary | ICD-10-CM | POA: Diagnosis not present

## 2018-05-11 DIAGNOSIS — Z881 Allergy status to other antibiotic agents status: Secondary | ICD-10-CM

## 2018-05-11 DIAGNOSIS — N320 Bladder-neck obstruction: Secondary | ICD-10-CM | POA: Diagnosis present

## 2018-05-11 DIAGNOSIS — R4182 Altered mental status, unspecified: Secondary | ICD-10-CM | POA: Diagnosis not present

## 2018-05-11 DIAGNOSIS — Z9181 History of falling: Secondary | ICD-10-CM

## 2018-05-11 DIAGNOSIS — W19XXXA Unspecified fall, initial encounter: Secondary | ICD-10-CM

## 2018-05-11 DIAGNOSIS — K922 Gastrointestinal hemorrhage, unspecified: Secondary | ICD-10-CM | POA: Diagnosis present

## 2018-05-11 DIAGNOSIS — Z79891 Long term (current) use of opiate analgesic: Secondary | ICD-10-CM

## 2018-05-11 DIAGNOSIS — Z833 Family history of diabetes mellitus: Secondary | ICD-10-CM

## 2018-05-11 DIAGNOSIS — E119 Type 2 diabetes mellitus without complications: Secondary | ICD-10-CM

## 2018-05-11 DIAGNOSIS — N179 Acute kidney failure, unspecified: Secondary | ICD-10-CM | POA: Diagnosis not present

## 2018-05-11 DIAGNOSIS — Z972 Presence of dental prosthetic device (complete) (partial): Secondary | ICD-10-CM

## 2018-05-11 DIAGNOSIS — S299XXA Unspecified injury of thorax, initial encounter: Secondary | ICD-10-CM | POA: Diagnosis not present

## 2018-05-11 DIAGNOSIS — I2510 Atherosclerotic heart disease of native coronary artery without angina pectoris: Secondary | ICD-10-CM | POA: Diagnosis present

## 2018-05-11 DIAGNOSIS — I10 Essential (primary) hypertension: Secondary | ICD-10-CM | POA: Diagnosis present

## 2018-05-11 DIAGNOSIS — G9349 Other encephalopathy: Secondary | ICD-10-CM | POA: Diagnosis not present

## 2018-05-11 DIAGNOSIS — Z90710 Acquired absence of both cervix and uterus: Secondary | ICD-10-CM | POA: Diagnosis not present

## 2018-05-11 DIAGNOSIS — E860 Dehydration: Secondary | ICD-10-CM | POA: Diagnosis present

## 2018-05-11 DIAGNOSIS — W19XXXD Unspecified fall, subsequent encounter: Secondary | ICD-10-CM | POA: Diagnosis not present

## 2018-05-11 DIAGNOSIS — Z79899 Other long term (current) drug therapy: Secondary | ICD-10-CM

## 2018-05-11 DIAGNOSIS — R102 Pelvic and perineal pain: Secondary | ICD-10-CM | POA: Diagnosis not present

## 2018-05-11 DIAGNOSIS — S0990XA Unspecified injury of head, initial encounter: Secondary | ICD-10-CM | POA: Diagnosis not present

## 2018-05-11 DIAGNOSIS — I129 Hypertensive chronic kidney disease with stage 1 through stage 4 chronic kidney disease, or unspecified chronic kidney disease: Secondary | ICD-10-CM | POA: Diagnosis not present

## 2018-05-11 DIAGNOSIS — Z91040 Latex allergy status: Secondary | ICD-10-CM

## 2018-05-11 DIAGNOSIS — Z880 Allergy status to penicillin: Secondary | ICD-10-CM

## 2018-05-11 DIAGNOSIS — E118 Type 2 diabetes mellitus with unspecified complications: Secondary | ICD-10-CM | POA: Diagnosis not present

## 2018-05-11 DIAGNOSIS — R410 Disorientation, unspecified: Secondary | ICD-10-CM | POA: Diagnosis not present

## 2018-05-11 DIAGNOSIS — G309 Alzheimer's disease, unspecified: Secondary | ICD-10-CM | POA: Diagnosis present

## 2018-05-11 DIAGNOSIS — I4891 Unspecified atrial fibrillation: Secondary | ICD-10-CM | POA: Diagnosis present

## 2018-05-11 DIAGNOSIS — S3993XA Unspecified injury of pelvis, initial encounter: Secondary | ICD-10-CM | POA: Diagnosis not present

## 2018-05-11 DIAGNOSIS — Z7189 Other specified counseling: Secondary | ICD-10-CM | POA: Diagnosis not present

## 2018-05-11 LAB — COMPREHENSIVE METABOLIC PANEL
ALBUMIN: 4.1 g/dL (ref 3.5–5.0)
ALT: 25 U/L (ref 14–54)
ANION GAP: 9 (ref 5–15)
AST: 39 U/L (ref 15–41)
Alkaline Phosphatase: 92 U/L (ref 38–126)
BILIRUBIN TOTAL: 0.7 mg/dL (ref 0.3–1.2)
BUN: 42 mg/dL — AB (ref 6–20)
CHLORIDE: 109 mmol/L (ref 101–111)
CO2: 21 mmol/L — AB (ref 22–32)
Calcium: 9.4 mg/dL (ref 8.9–10.3)
Creatinine, Ser: 3.82 mg/dL — ABNORMAL HIGH (ref 0.44–1.00)
GFR calc Af Amer: 11 mL/min — ABNORMAL LOW (ref >60)
GFR calc non Af Amer: 10 mL/min — ABNORMAL LOW (ref >60)
GLUCOSE: 112 mg/dL — AB (ref 65–99)
POTASSIUM: 6.4 mmol/L — AB (ref 3.5–5.1)
SODIUM: 139 mmol/L (ref 135–145)
TOTAL PROTEIN: 7.2 g/dL (ref 6.5–8.1)

## 2018-05-11 LAB — TROPONIN I

## 2018-05-11 LAB — CBC WITH DIFFERENTIAL/PLATELET
BASOS ABS: 0 10*3/uL (ref 0.0–0.1)
BASOS PCT: 0 %
EOS ABS: 0 10*3/uL (ref 0.0–0.7)
EOS PCT: 0 %
HCT: 23.5 % — ABNORMAL LOW (ref 36.0–46.0)
Hemoglobin: 7.7 g/dL — ABNORMAL LOW (ref 12.0–15.0)
Lymphocytes Relative: 14 %
Lymphs Abs: 1 10*3/uL (ref 0.7–4.0)
MCH: 30.4 pg (ref 26.0–34.0)
MCHC: 32.8 g/dL (ref 30.0–36.0)
MCV: 92.9 fL (ref 78.0–100.0)
MONO ABS: 0.3 10*3/uL (ref 0.1–1.0)
Monocytes Relative: 4 %
NEUTROS ABS: 6.1 10*3/uL (ref 1.7–7.7)
Neutrophils Relative %: 82 %
PLATELETS: 220 10*3/uL (ref 150–400)
RBC: 2.53 MIL/uL — ABNORMAL LOW (ref 3.87–5.11)
RDW: 14.6 % (ref 11.5–15.5)
WBC: 7.4 10*3/uL (ref 4.0–10.5)

## 2018-05-11 LAB — URINALYSIS, ROUTINE W REFLEX MICROSCOPIC
BACTERIA UA: NONE SEEN
Bilirubin Urine: NEGATIVE
GLUCOSE, UA: NEGATIVE mg/dL
HGB URINE DIPSTICK: NEGATIVE
Ketones, ur: NEGATIVE mg/dL
LEUKOCYTES UA: NEGATIVE
Nitrite: NEGATIVE
PROTEIN: 30 mg/dL — AB
Specific Gravity, Urine: 1.013 (ref 1.005–1.030)
pH: 5 (ref 5.0–8.0)

## 2018-05-11 LAB — CBC
HEMATOCRIT: 23 % — AB (ref 36.0–46.0)
Hemoglobin: 7.6 g/dL — ABNORMAL LOW (ref 12.0–15.0)
MCH: 30.9 pg (ref 26.0–34.0)
MCHC: 33 g/dL (ref 30.0–36.0)
MCV: 93.5 fL (ref 78.0–100.0)
Platelets: 208 10*3/uL (ref 150–400)
RBC: 2.46 MIL/uL — ABNORMAL LOW (ref 3.87–5.11)
RDW: 14.6 % (ref 11.5–15.5)
WBC: 6.1 10*3/uL (ref 4.0–10.5)

## 2018-05-11 LAB — POC OCCULT BLOOD, ED
FECAL OCCULT BLD: POSITIVE — AB
Fecal Occult Bld: NEGATIVE

## 2018-05-11 LAB — AMMONIA: AMMONIA: 14 umol/L (ref 9–35)

## 2018-05-11 LAB — ABO/RH: ABO/RH(D): O NEG

## 2018-05-11 LAB — GLUCOSE, CAPILLARY: GLUCOSE-CAPILLARY: 80 mg/dL (ref 65–99)

## 2018-05-11 MED ORDER — SODIUM CHLORIDE 0.9 % IV SOLN
1.0000 g | Freq: Once | INTRAVENOUS | Status: AC
  Administered 2018-05-11: 1 g via INTRAVENOUS
  Filled 2018-05-11: qty 10

## 2018-05-11 MED ORDER — DEXTROSE 50 % IV SOLN: 1.0000 | Freq: Once | INTRAVENOUS | Status: DC

## 2018-05-11 MED ORDER — MORPHINE SULFATE (PF) 2 MG/ML IV SOLN
0.5000 mg | Freq: Once | INTRAVENOUS | Status: AC
  Administered 2018-05-11: 0.5 mg via INTRAVENOUS
  Filled 2018-05-11: qty 1

## 2018-05-11 MED ORDER — DEXTROSE 50 % IV SOLN
50.0000 mL | Freq: Once | INTRAVENOUS | Status: AC
  Administered 2018-05-11: 50 mL via INTRAVENOUS
  Filled 2018-05-11: qty 50

## 2018-05-11 MED ORDER — INSULIN ASPART 100 UNIT/ML IV SOLN
10.0000 [IU] | Freq: Once | INTRAVENOUS | Status: AC
  Administered 2018-05-11: 10 [IU] via INTRAVENOUS
  Filled 2018-05-11 (×2): qty 0.1

## 2018-05-11 MED ORDER — SODIUM POLYSTYRENE SULFONATE 15 GM/60ML PO SUSP
30.0000 g | Freq: Once | ORAL | Status: DC
  Filled 2018-05-11: qty 120

## 2018-05-11 MED ORDER — ORAL CARE MOUTH RINSE
15.0000 mL | Freq: Two times a day (BID) | OROMUCOSAL | Status: DC
  Administered 2018-05-12 – 2018-05-14 (×5): 15 mL via OROMUCOSAL

## 2018-05-11 MED ORDER — ACETAMINOPHEN 325 MG PO TABS
650.0000 mg | ORAL_TABLET | Freq: Four times a day (QID) | ORAL | Status: DC | PRN
  Administered 2018-05-12 – 2018-05-13 (×3): 650 mg via ORAL
  Filled 2018-05-11 (×3): qty 2

## 2018-05-11 MED ORDER — SODIUM CHLORIDE 0.9 % IV BOLUS
1000.0000 mL | Freq: Once | INTRAVENOUS | Status: AC
  Administered 2018-05-11: 1000 mL via INTRAVENOUS

## 2018-05-11 MED ORDER — METOPROLOL TARTRATE 5 MG/5ML IV SOLN
2.5000 mg | Freq: Three times a day (TID) | INTRAVENOUS | Status: DC
  Administered 2018-05-11 – 2018-05-13 (×5): 2.5 mg via INTRAVENOUS
  Filled 2018-05-11 (×5): qty 5

## 2018-05-11 MED ORDER — SODIUM CHLORIDE 0.9 % IV SOLN: INTRAVENOUS | Status: AC

## 2018-05-11 MED ORDER — SODIUM CHLORIDE 0.9 % IV SOLN
INTRAVENOUS | Status: DC
  Administered 2018-05-11: 22:00:00 via INTRAVENOUS

## 2018-05-11 MED ORDER — INSULIN ASPART 100 UNIT/ML ~~LOC~~ SOLN: 10.0000 [IU] | Freq: Once | SUBCUTANEOUS | Status: DC

## 2018-05-11 MED ORDER — ONDANSETRON HCL 4 MG/2ML IJ SOLN: 4.0000 mg | Freq: Four times a day (QID) | INTRAMUSCULAR | Status: DC | PRN

## 2018-05-11 MED ORDER — SODIUM CHLORIDE 0.9 % IV SOLN: Freq: Once | INTRAVENOUS | Status: DC

## 2018-05-11 MED ORDER — LORAZEPAM 2 MG/ML IJ SOLN
INTRAMUSCULAR | Status: AC
  Filled 2018-05-11: qty 1

## 2018-05-11 MED ORDER — LORAZEPAM 2 MG/ML IJ SOLN
1.0000 mg | Freq: Once | INTRAMUSCULAR | Status: AC
  Administered 2018-05-11: 1 mg via INTRAVENOUS

## 2018-05-11 MED ORDER — ONDANSETRON HCL 4 MG PO TABS: 4.0000 mg | ORAL_TABLET | Freq: Four times a day (QID) | ORAL | Status: DC | PRN

## 2018-05-11 MED ORDER — ACETAMINOPHEN 650 MG RE SUPP: 650.0000 mg | Freq: Four times a day (QID) | RECTAL | Status: DC | PRN

## 2018-05-11 NOTE — ED Notes (Signed)
Bed: WA02 Expected date:  Expected time:  Means of arrival:  Comments: 

## 2018-05-11 NOTE — H&P (Signed)
History and Physical    Shari Golden ZOX:[id number] DOB: Aug 14, 1930 DOA: 05/11/2018  PCP: Richardean Chimera, MD  Patient coming from: Home.  History obtained from patient's daughter and granddaughter.  Chief Complaint: Confusion and falls.  HPI: Shari Golden is a 84 y.o. female with history of advanced dementia, chronic kidney disease stage IV, hypertension, atrial fibrillation, anemia was brought to the ER for the second time and last 2 weeks after patient had a fall and increasing confusion.  Patient was brought on Apr 29, 2018 2 weeks ago after patient had a fall which patient fell backwards hit on the buttocks.  At that time x-rays show degenerative changes of the spine.  Patient was sent home on tramadol.  Patient only rarely took tramadol last dose was 3 days ago.  Over the last 3 days patient was found to be increasingly confused eating less and had another fall over the last 24 hours falling face forward.  Did not lose consciousness.  Patient has become more and more confused with not following commands like her baseline.  ED Course: CT head and C-spine in the ER was unremarkable.  Patient appears to be not following commands and appears to be restless.  Lab work show worsening of kidney function with hyperkalemia and lowering of her hemoglobin from baseline of 9-7.  Stool for occult blood was positive.  Ativan was given with minimal response.  Morphine has been ordered for likely pain.  Review of Systems: As per HPI, rest all negative.   Past Medical History:  Diagnosis Date  . Anemia   . Anxiety   . Arthritis   . Carotid artery disease (HCC)    a. 12/2015 Carotid u/s: 1-39% bilat ICA stenosis-->f/u prn.  . CKD (chronic kidney disease), stage III (HCC)    Dr. Fausto Skillern  . Constipation   . Essential hypertension   . GERD (gastroesophageal reflux disease)   . History of cardiac catheterization    a. 2004 Cath: Normal coronaries.  Marland Kitchen PAF (paroxysmal atrial fibrillation) (HCC)     a. Remotely documented without recurrence.  . Peripheral neuropathy   . Type II diabetes mellitus (HCC)    a. 01/2017 HbA1c 7.0.    Past Surgical History:  Procedure Laterality Date  . ABDOMINAL HYSTERECTOMY    . APPENDECTOMY    . CARPAL TUNNEL RELEASE Bilateral   . LUMBAR LAMINECTOMY  ~ 2011  . SHOULDER ARTHROSCOPY W/ ROTATOR CUFF REPAIR Left   . TONSILLECTOMY  2012  . WRIST FUSION Right 04/01/2013   Procedure: RIGHT WRIST ARTHRODESIS WITH POSSIBLE LOCAL BONE GRAFT;  Surgeon: Sharma Covert, MD;  Location: [facility] OR;  Service: Orthopedics;  Laterality: Right;     reports that she has never smoked. She has quit using smokeless tobacco. Her smokeless tobacco use included snuff. She reports that she does not drink alcohol or use drugs.  Allergies  Allergen Reactions  . Erythromycin Other (See Comments)    REACTION: Dehydration  . Iodine Itching, Rash and Other (See Comments)    REACTION: dizzy  . Iohexol Other (See Comments)     Code: HIVES, Desc: IODINE BASED CONTRAST   . Latex Swelling and Other (See Comments)    REACTION: Swelling (If touched on face will have facial swelling)  . Penicillins Itching and Rash    Has patient had a PCN reaction causing immediate rash, facial/tongue/throat swelling, SOB or lightheadedness with hypotension: unknown Has patient had a PCN reaction causing severe rash involving mucus  membranes or skin necrosis: Unknown Has patient had a PCN reaction that required hospitalization: Unknown Has patient had a PCN reaction occurring within the last 10 years: Unknown If all of the above answers are "NO", then may proceed with Cephalosporin use.     Family History  Problem Relation Age of Onset  . Diabetes Sister   . Heart disease Sister   . Prostate cancer Father   . Stroke Neg Hx   . Coronary artery disease Neg Hx     Prior to Admission medications   Medication Sig Start Date End Date Taking? Authorizing Provider  acetaminophen (TYLENOL) 325 MG  tablet Take 650 mg by mouth every 6 (six) hours as needed for moderate pain.    Yes [provider]  diltiazem (CARDIZEM CD) 360 MG 24 hr capsule Take 360 mg by mouth daily.   Yes [provider]  insulin glargine (LANTUS) 100 UNIT/ML injection Inject 8 Units into the skin daily. Sliding scale   Yes [provider]  metoprolol succinate (TOPROL-XL) 25 MG 24 hr tablet TAKE 1 TABLET BY MOUTH ONCE DAILY 03/28/18  Yes Chilton Si, MD  multivitamin (RENA-VIT) TABS tablet Take 1 tablet by mouth at bedtime.    Yes [provider]  pantoprazole (PROTONIX) 40 MG tablet Take 40 mg by mouth at bedtime.    Yes [provider]  sertraline (ZOLOFT) 100 MG tablet Take 100 mg by mouth at bedtime.  02/19/17  Yes Richardean Chimera, MD  traMADol-acetaminophen (ULTRACET) 37.5-325 MG tablet Take 1 tablet by mouth every 6 (six) hours as needed. Patient taking differently: Take 1 tablet by mouth every 6 (six) hours as needed for moderate pain.  04/29/18  Yes Mancel Bale, MD    Physical Exam: Vitals:   05/11/18 1419 05/11/18 1559 05/11/18 1815 05/11/18 1817  BP: 139/65 (!) 172/136 (!) 152/66   Pulse: 78 82 80   Resp: 18 19 18    Temp: 98 F (36.7 C)   98.4 F (36.9 C)  TempSrc: Oral   Rectal  SpO2: 98% 100% 96%       Constitutional: Moderately built and poorly nourished. Vitals:   05/11/18 1419 05/11/18 1559 05/11/18 1815 05/11/18 1817  BP: 139/65 (!) 172/136 (!) 152/66   Pulse: 78 82 80   Resp: 18 19 18    Temp: 98 F (36.7 C)   98.4 F (36.9 C)  TempSrc: Oral   Rectal  SpO2: 98% 100% 96%    Eyes: Anicteric no pallor. ENMT: No discharge from the ears eyes nose or mouth. Neck: No mass felt.  No neck rigidity. Respiratory: No rhonchi or crepitations. Cardiovascular: S1-S2 heard no murmurs appreciated. Abdomen: Soft nontender bowel sounds present. Musculoskeletal: No edema.  No joint effusion. Skin: No rash. Neurologic: Alert awake not oriented.   Does not follow commands moves all extremities. Psychiatric: Confused.   Labs on Admission: I have personally reviewed following labs and imaging studies  CBC: Recent Labs  Lab 05/11/18 1653  WBC 7.4  NEUTROABS 6.1  HGB 7.7*  HCT 23.5*  MCV 92.9  PLT 220   Basic Metabolic Panel: Recent Labs  Lab 05/11/18 1653  NA 139  K 6.4*  CL 109  CO2 21*  GLUCOSE 112*  BUN 42*  CREATININE 3.82*  CALCIUM 9.4   GFR: CrCl cannot be calculated (Unknown ideal weight.). Liver Function Tests: Recent Labs  Lab 05/11/18 1653  AST 39  ALT 25  ALKPHOS 92  BILITOT 0.7  PROT 7.2  ALBUMIN 4.1   No results for input(s): LIPASE, AMYLASE in the last 168 hours. No results for input(s): AMMONIA in the last 168 hours. Coagulation Profile: No results for input(s): INR, PROTIME in the last 168 hours. Cardiac Enzymes: Recent Labs  Lab 05/11/18 1650  TROPONINI <0.03   BNP (last 3 results) No results for input(s): PROBNP in the last 8760 hours. HbA1C: No results for input(s): HGBA1C in the last 72 hours. CBG: No results for input(s): GLUCAP in the last 168 hours. Lipid Profile: No results for input(s): CHOL, HDL, LDLCALC, TRIG, CHOLHDL, LDLDIRECT in the last 72 hours. Thyroid Function Tests: No results for input(s): TSH, T4TOTAL, FREET4, T3FREE, THYROIDAB in the last 72 hours. Anemia Panel: No results for input(s): VITAMINB12, FOLATE, FERRITIN, TIBC, IRON, RETICCTPCT in the last 72 hours. Urine analysis:    Component Value Date/Time   COLORURINE YELLOW 05/11/2018 1822   APPEARANCEUR CLEAR 05/11/2018 1822   LABSPEC 1.013 05/11/2018 1822   PHURINE 5.0 05/11/2018 1822   GLUCOSEU NEGATIVE 05/11/2018 1822   HGBUR NEGATIVE 05/11/2018 1822   BILIRUBINUR NEGATIVE 05/11/2018 1822   KETONESUR NEGATIVE 05/11/2018 1822   PROTEINUR 30 (A) 05/11/2018 1822   NITRITE NEGATIVE 05/11/2018 1822   LEUKOCYTESUR NEGATIVE 05/11/2018 1822   Sepsis  Labs: @LABRCNTIP (procalcitonin:4,lacticidven:4) )No results found for this or any previous visit (from the past 240 hour(s)).   Radiological Exams on Admission: Ct Head Wo Contrast  Result Date: 05/11/2018 CLINICAL DATA:  Worsening confusion. Recent fall with head injury. Recent urinary tract infection. History of dementia. EXAM: CT HEAD WITHOUT CONTRAST CT CERVICAL SPINE WITHOUT CONTRAST TECHNIQUE: Multidetector CT imaging of the head and cervical spine was performed following the standard protocol without intravenous contrast. Multiplanar CT image reconstructions of the cervical spine were also generated. COMPARISON:  Brain MRI 04/30/2016 FINDINGS: CT HEAD FINDINGS Brain: There is no evidence of acute large territory infarct, intracranial hemorrhage, mass, midline shift, or extra-axial fluid collection. Mild cerebral atrophy is within normal limits for age. Periventricular white matter hypodensities are nonspecific but compatible with minimal for age chronic small vessel ischemic disease. Vascular: Calcified atherosclerosis at the skull base. No gross hyperdense vessel. Skull: No fracture focal osseous lesion. Sinuses/Orbits: Visualized paranasal sinuses and mastoid air cells are clear. Bilateral cataract extraction is noted. Other: None. CT CERVICAL SPINE FINDINGS Alignment: Cervical spine straightening. Trace retrolisthesis of C5 on C6, likely degenerative. Skull base and vertebrae: No acute fracture or suspicious osseous lesion. Soft tissues and spinal canal: No prevertebral fluid or swelling. No visible canal hematoma. Disc levels: Focally advanced disc degeneration at C5-6 including severe disc space narrowing, endplate sclerosis, and spurring resulting in mild left greater than right neural foraminal stenosis. Mild multilevel left-sided cervical facet arthrosis. No significant osseous spinal canal stenosis. Upper chest: Clear lung apices. Other: Left greater than right thyroid lobe enlargement and  heterogeneity diffusely with evidence of multiple small underlying nodules compatible with multinodular goiter. Moderate right and extensive left carotid bifurcation calcified atherosclerosis. IMPRESSION: No evidence of acute intracranial abnormality or cervical spine fracture. Electronically Signed   By: Sebastian Ache M.D.   On: 05/11/2018 18:13   Ct Cervical Spine Wo Contrast  Result Date: 05/11/2018 CLINICAL DATA:  Worsening confusion. Recent fall with head injury. Recent urinary tract infection. History of dementia. EXAM: CT HEAD WITHOUT CONTRAST CT CERVICAL SPINE WITHOUT CONTRAST TECHNIQUE: Multidetector CT imaging of the head and cervical spine was performed following the standard protocol without intravenous contrast. Multiplanar CT image reconstructions of the cervical spine were  also generated. COMPARISON:  Brain MRI 04/30/2016 FINDINGS: CT HEAD FINDINGS Brain: There is no evidence of acute large territory infarct, intracranial hemorrhage, mass, midline shift, or extra-axial fluid collection. Mild cerebral atrophy is within normal limits for age. Periventricular white matter hypodensities are nonspecific but compatible with minimal for age chronic small vessel ischemic disease. Vascular: Calcified atherosclerosis at the skull base. No gross hyperdense vessel. Skull: No fracture focal osseous lesion. Sinuses/Orbits: Visualized paranasal sinuses and mastoid air cells are clear. Bilateral cataract extraction is noted. Other: None. CT CERVICAL SPINE FINDINGS Alignment: Cervical spine straightening. Trace retrolisthesis of C5 on C6, likely degenerative. Skull base and vertebrae: No acute fracture or suspicious osseous lesion. Soft tissues and spinal canal: No prevertebral fluid or swelling. No visible canal hematoma. Disc levels: Focally advanced disc degeneration at C5-6 including severe disc space narrowing, endplate sclerosis, and spurring resulting in mild left greater than right neural foraminal stenosis.  Mild multilevel left-sided cervical facet arthrosis. No significant osseous spinal canal stenosis. Upper chest: Clear lung apices. Other: Left greater than right thyroid lobe enlargement and heterogeneity diffusely with evidence of multiple small underlying nodules compatible with multinodular goiter. Moderate right and extensive left carotid bifurcation calcified atherosclerosis. IMPRESSION: No evidence of acute intracranial abnormality or cervical spine fracture. Electronically Signed   By: Sebastian AcheAllen  Grady M.D.   On: 05/11/2018 18:13    EKG: Independently reviewed.  Normal sinus rhythm.  Assessment/Plan Principal Problem:   Acute encephalopathy Active Problems:   Hypertension   Atrial fibrillation (HCC)   Type II diabetes mellitus (HCC)   Acute blood loss anemia   ARF (acute renal failure) (HCC)    1. Acute encephalopathy -cause not clear.  Likely metabolic given the worsening renal function.  Patient is afebrile.  UA does not show any definite signs of infection.  Will get chest x-ray.  I have ordered 1 dose of morphine to see if patient's pain relief may improve her mental status. 2. Acute on chronic kidney disease stage IV with hyperkalemia -D50 insulin and calcium gluconate has been ordered.  Kayexalate will be given if patient can swallow.  Follow metabolic panel gently hydrate. 3. GI bleed with blood loss anemia -follow CBC. 4. Diabetes mellitus type 2 -follow CBGs every 4. 5. History of atrial fibrillation not a candidate for anticoagulation secondary to risk of falls -I have dosed patient IV metoprolol since patient cannot swallow at this time due to encephalopathy. 6. Hypertension -we will keep patient on PRN IV hydralazine.  Had extensive discussion with patient's daughter and granddaughter.  At this time our plan is to keep patient comfortable and no extensive work-up.  If patient's mental status and general condition does not improve we will be moving towards comfort measures  only.  X-ray chest and pelvis are pending.   DVT prophylaxis: SCDs. Code Status: DNR. Family Communication: Patient's daughter and granddaughter. Disposition Plan: To be determined. Consults called: Palliative team. Admission status: Observation.   Eduard ClosArshad N Peytin Dechert MD [facility] Pager [phone].  If 7PM-7AM, please contact night-coverage [email or web address] Password [email or web address]  05/11/2018, 8:10 PM

## 2018-05-11 NOTE — ED Triage Notes (Signed)
Pt daughter states the has been more confused, not answering questions as usual, falling more over the pas 24 hours. Pt has a hx of dementia.

## 2018-05-11 NOTE — ED Provider Notes (Signed)
I saw and evaluated the patient, reviewed the resident's note and I agree with the findings and plan.  EKG: None 84 year old female with history of dementia here with more confusion over the past 24 hours.  Has had a mechanical fall recently.  Did strike her head.  Her daughter states her baseline is that she cannot carry conversation but that she is alert.  Treated for UTI 9 days ago.  On exam here she is restless and does have abrasion noted on her frontal scalp region.  Labs and x-rays are pending at this time.   Lorre NickAllen, Ravenna Legore, MD 05/11/18 [phone]

## 2018-05-11 NOTE — ED Provider Notes (Signed)
[facility]-EMERGENCY DEPT Provider Note   CSN: [id number] Arrival date & time: 05/11/18  1402     History   Chief Complaint Chief Complaint  Patient presents with  . Altered Mental Status    HPI Shari Golden is a 84 y.o. female with CAD, CKD stage III, essential hypertension, diabetes mellitus type 2, paroxysmal atrial fibrillation, and alzheimers dementia who presents with altered mental status.   The patient was mumbling, flailing her upper and lower extremities and not responsive to anyone. The patient was accompanied by her daughter and granddaughter who note that the patient has not been her usual self since Wednesday 05/07/18. At baseline the patient is noted to answer briefly to questions and is able to feed herself. However, over the past few days the patient has been minimally responsive, has not recognized those around him, and has been agitated. The patient had a fall on Wednesday when she was laughing and fell forward in her chair. The patient also fell this morning when her family members were trying to help her get dressed and she fell backward when raising her leg. She hit her head.   The patient has also told mentioned that she has back pain.  The patient was recently seen in the [facility] ED on 04/29/18 for urinary tract infection that was treated with bactrim 800-160mg  once.   Past Medical History:  Diagnosis Date  . Anemia   . Anxiety   . Arthritis   . Carotid artery disease (HCC)    a. 12/2015 Carotid u/s: 1-39% bilat ICA stenosis-->f/u prn.  . CKD (chronic kidney disease), stage III (HCC)    Dr. Fausto Skillern  . Constipation   . Essential hypertension   . GERD (gastroesophageal reflux disease)   . History of cardiac catheterization    a. 2004 Cath: Normal coronaries.  Marland Kitchen PAF (paroxysmal atrial fibrillation) (HCC)    a. Remotely documented without recurrence.  . Peripheral neuropathy   . Type II diabetes mellitus (HCC)    a. 01/2017 HbA1c  7.0.    Patient Active Problem List   Diagnosis Date Noted  . Controlled diabetes mellitus type 2 with complications (HCC)   . Accidental overdose   . Bradycardia 01/31/2017  . Essential hypertension 01/31/2017  . Anemia 01/31/2017  . Hyperlipidemia 01/31/2017  . Hyperkalemia 01/31/2017  . Burning sensation-right arm/wrist 11/17/2013  . Weakness of wrist-Right, and arm 11/17/2013  . Occlusion and stenosis of carotid artery without mention of cerebral infarction 11/04/2012  . Type II diabetes mellitus (HCC) 10/24/2011  . Hypertension   . CKD (chronic kidney disease), stage III (HCC)   . GERD (gastroesophageal reflux disease)   . Peripheral neuropathy   . Chest tightness   . Atrial fibrillation (HCC)   . Ejection fraction   . Mitral regurgitation   . Atrial septal aneurysm   . Edema   . Anxiety   . Carotid artery disease [facility])     Past Surgical History:  Procedure Laterality Date  . ABDOMINAL HYSTERECTOMY    . APPENDECTOMY    . CARPAL TUNNEL RELEASE Bilateral   . LUMBAR LAMINECTOMY  ~ 2011  . SHOULDER ARTHROSCOPY W/ ROTATOR CUFF REPAIR Left   . TONSILLECTOMY  2012  . WRIST FUSION Right 04/01/2013   Procedure: RIGHT WRIST ARTHRODESIS WITH POSSIBLE LOCAL BONE GRAFT;  Surgeon: Sharma Covert, MD;  Location: [facility] OR;  Service: Orthopedics;  Laterality: Right;     OB History   None  Home Medications    Prior to Admission medications   Medication Sig Start Date End Date Taking? Authorizing Provider  acetaminophen (TYLENOL) 325 MG tablet Take 650 mg by mouth every 6 (six) hours as needed.    [provider]  aspirin 81 MG chewable tablet Chew 81 mg by mouth daily.    [provider]  diltiazem (CARDIZEM CD) 360 MG 24 hr capsule Take 360 mg by mouth daily.    [provider]  insulin glargine (LANTUS) 100 UNIT/ML injection Inject 8 Units into the skin daily. Sliding scale    [provider]  metoprolol succinate (TOPROL-XL) 25 MG 24  hr tablet TAKE 1 TABLET BY MOUTH ONCE DAILY 03/28/18   Chilton Siandolph, Tiffany, MD  multivitamin (RENA-VIT) TABS tablet Take 1 tablet by mouth daily.      [provider]  pantoprazole (PROTONIX) 40 MG tablet Take 40 mg by mouth daily.      [provider]  sertraline (ZOLOFT) 100 MG tablet Take 100 mg by mouth daily.  02/19/17   Richardean Chimeraaniel, Terry G, MD  traMADol-acetaminophen (ULTRACET) 37.5-325 MG tablet Take 1 tablet by mouth every 6 (six) hours as needed. 04/29/18   Mancel BaleWentz, Elliott, MD    Family History Family History  Problem Relation Age of Onset  . Diabetes Sister   . Heart disease Sister   . Prostate cancer Father   . Stroke Neg Hx   . Coronary artery disease Neg Hx     Social History Social History   Tobacco Use  . Smoking status: Never Smoker  . Smokeless tobacco: Former NeurosurgeonUser    Types: Snuff  . Tobacco comment: 04/01/2013 "smoked 3 cigarettes in 1956; stopped; quit dipping > 20 yr ago"  Substance Use Topics  . Alcohol use: No    Alcohol/week: 0.0 oz  . Drug use: No     Allergies   Erythromycin; Iodine; Iohexol; Latex; and Penicillins   Review of Systems Back pain, decreased appetite, agitated, and flailing No nausea, vomiting   Physical Exam Updated Vital Signs BP (!) 172/136   Pulse 82   Temp 98 F (36.7 C) (Oral)   Resp 19   SpO2 100%   Physical Exam  Constitutional: She appears well-developed and well-nourished.  HENT:  Head: Normocephalic and atraumatic.  Eyes: Pupils are equal, round, and reactive to light. Conjunctivae are normal.  Cardiovascular: Normal rate, regular rhythm and normal heart sounds.  Pulmonary/Chest: Effort normal and breath sounds normal. No stridor. No respiratory distress.  Abdominal: Soft. Bowel sounds are normal. She exhibits no distension. There is no tenderness.  Musculoskeletal:  Spontaneously moves both upper and lower extremities  Neurological: She exhibits normal muscle tone.  Patient is mumbling. Unable to do  neurological exam due to lack of patient participation.  Skin:  2-3cm laceration superficially in the anterior portion of her head  Psychiatric: She has a normal mood and affect. Her behavior is normal. Judgment and thought content normal.     ED Treatments / Results  Labs (all labs ordered are listed, but only abnormal results are displayed) Labs Reviewed - No data to display  EKG None  Radiology No results found.  Procedures Procedures (including critical care time)  Medications Ordered in ED Medications - No data to display   Initial Impression / Assessment and Plan / ED Course  I have reviewed the triage vital signs and the nursing notes.  Pertinent labs & imaging results that were available during my care of the  patient were reviewed by me and considered in my medical decision making (see chart for details).  The patient presented with altered mental status and agitation over the past 5 days. Patient's labs remarkable for hyperkalemia 6.4 and acute on chronic kidney injury with cr=3.82 (baseline 2.5-2.8). The patient's kidney injury and ams is likely secondary to dehydration and therefore the patient was given IVF (1L bolus and started on NS infusion at 6ml/hr for 10 hrs), potassium=6.4 which was corrected with insulin 1amp d50 and kayexalate 30g. EKG without any st or t wave changes.   Patient's hemoglobin has also dropped from 10.4 to 7.7 over the past 12 days. FOBT positive.   Patient's ua did not show presence of nitrities or leukocytes to indicate possible uti. Pending urine culture. CT head and cervical spine without any acute intracranial abnormality or cervical spine fracture.  Patient is thought to have chronic back pain. Recent lumbar and thoracic spine xrays done on 04/29/18 did not show any compression deformity. There was diffuse degenerative disc disease and anterolisthesis at l5 on s1.  Called hospitalist to admit for acute encephalopathy, acute on chronic  renal failure, and hyperkalemia.   Final Clinical Impressions(s) / ED Diagnoses   Final diagnoses:  None    ED Discharge Orders    None       Lorenso Courier, MD 05/11/18 1934    Lorre Nick, MD 05/11/18 2250

## 2018-05-11 NOTE — ED Notes (Signed)
Patient transported to CT 

## 2018-05-12 ENCOUNTER — Observation Stay (HOSPITAL_COMMUNITY): Payer: Medicare Other

## 2018-05-12 DIAGNOSIS — R102 Pelvic and perineal pain: Secondary | ICD-10-CM | POA: Diagnosis not present

## 2018-05-12 DIAGNOSIS — I10 Essential (primary) hypertension: Secondary | ICD-10-CM | POA: Diagnosis not present

## 2018-05-12 DIAGNOSIS — E118 Type 2 diabetes mellitus with unspecified complications: Secondary | ICD-10-CM | POA: Diagnosis not present

## 2018-05-12 DIAGNOSIS — I480 Paroxysmal atrial fibrillation: Secondary | ICD-10-CM | POA: Diagnosis not present

## 2018-05-12 DIAGNOSIS — G9340 Encephalopathy, unspecified: Secondary | ICD-10-CM | POA: Diagnosis not present

## 2018-05-12 DIAGNOSIS — G309 Alzheimer's disease, unspecified: Secondary | ICD-10-CM | POA: Diagnosis not present

## 2018-05-12 DIAGNOSIS — G9349 Other encephalopathy: Secondary | ICD-10-CM | POA: Diagnosis not present

## 2018-05-12 DIAGNOSIS — D62 Acute posthemorrhagic anemia: Secondary | ICD-10-CM | POA: Diagnosis not present

## 2018-05-12 DIAGNOSIS — E042 Nontoxic multinodular goiter: Secondary | ICD-10-CM | POA: Diagnosis not present

## 2018-05-12 DIAGNOSIS — Z794 Long term (current) use of insulin: Secondary | ICD-10-CM | POA: Diagnosis not present

## 2018-05-12 DIAGNOSIS — N179 Acute kidney failure, unspecified: Secondary | ICD-10-CM

## 2018-05-12 DIAGNOSIS — S3993XA Unspecified injury of pelvis, initial encounter: Secondary | ICD-10-CM | POA: Diagnosis not present

## 2018-05-12 DIAGNOSIS — S299XXA Unspecified injury of thorax, initial encounter: Secondary | ICD-10-CM | POA: Diagnosis not present

## 2018-05-12 DIAGNOSIS — W19XXXA Unspecified fall, initial encounter: Secondary | ICD-10-CM

## 2018-05-12 LAB — CBC
HCT: 21.6 % — ABNORMAL LOW (ref 36.0–46.0)
HEMATOCRIT: 20.9 % — AB (ref 36.0–46.0)
HEMOGLOBIN: 7 g/dL — AB (ref 12.0–15.0)
HEMOGLOBIN: 7 g/dL — AB (ref 12.0–15.0)
MCH: 30.3 pg (ref 26.0–34.0)
MCH: 31.3 pg (ref 26.0–34.0)
MCHC: 32.4 g/dL (ref 30.0–36.0)
MCHC: 33.5 g/dL (ref 30.0–36.0)
MCV: 93.5 fL (ref 78.0–100.0)
MCV: 93.3 fL (ref 78.0–100.0)
PLATELETS: 202 10*3/uL (ref 150–400)
Platelets: 191 10*3/uL (ref 150–400)
RBC: 2.31 MIL/uL — AB (ref 3.87–5.11)
RBC: 2.24 MIL/uL — AB (ref 3.87–5.11)
RDW: 14.7 % (ref 11.5–15.5)
RDW: 15 % (ref 11.5–15.5)
WBC: 5 10*3/uL (ref 4.0–10.5)
WBC: 4.3 10*3/uL (ref 4.0–10.5)

## 2018-05-12 LAB — HEPATIC FUNCTION PANEL
ALK PHOS: 80 U/L (ref 38–126)
ALT: 21 U/L (ref 14–54)
AST: 33 U/L (ref 15–41)
Albumin: 3.2 g/dL — ABNORMAL LOW (ref 3.5–5.0)
BILIRUBIN TOTAL: 0.7 mg/dL (ref 0.3–1.2)
Bilirubin, Direct: 0.1 mg/dL (ref 0.1–0.5)
Indirect Bilirubin: 0.6 mg/dL (ref 0.3–0.9)
Total Protein: 6 g/dL — ABNORMAL LOW (ref 6.5–8.1)

## 2018-05-12 LAB — BASIC METABOLIC PANEL
ANION GAP: 9 (ref 5–15)
Anion gap: 7 (ref 5–15)
Anion gap: 6 (ref 5–15)
BUN: 36 mg/dL — ABNORMAL HIGH (ref 6–20)
BUN: 34 mg/dL — AB (ref 6–20)
BUN: 33 mg/dL — ABNORMAL HIGH (ref 6–20)
CHLORIDE: 110 mmol/L (ref 101–111)
CHLORIDE: 116 mmol/L — AB (ref 101–111)
CO2: 19 mmol/L — ABNORMAL LOW (ref 22–32)
CO2: 21 mmol/L — ABNORMAL LOW (ref 22–32)
CO2: 19 mmol/L — AB (ref 22–32)
CREATININE: 3.22 mg/dL — AB (ref 0.44–1.00)
CREATININE: 2.89 mg/dL — AB (ref 0.44–1.00)
Calcium: 8.8 mg/dL — ABNORMAL LOW (ref 8.9–10.3)
Calcium: 9.2 mg/dL (ref 8.9–10.3)
Calcium: 9.3 mg/dL (ref 8.9–10.3)
Chloride: 114 mmol/L — ABNORMAL HIGH (ref 101–111)
Creatinine, Ser: 3.05 mg/dL — ABNORMAL HIGH (ref 0.44–1.00)
GFR calc non Af Amer: 13 mL/min — ABNORMAL LOW (ref >60)
GFR, EST AFRICAN AMERICAN: 14 mL/min — AB (ref >60)
GFR, EST AFRICAN AMERICAN: 16 mL/min — AB (ref >60)
GFR, EST AFRICAN AMERICAN: 15 mL/min — AB (ref >60)
GFR, EST NON AFRICAN AMERICAN: 12 mL/min — AB (ref >60)
GFR, EST NON AFRICAN AMERICAN: 14 mL/min — AB (ref >60)
Glucose, Bld: 118 mg/dL — ABNORMAL HIGH (ref 65–99)
Glucose, Bld: 80 mg/dL (ref 65–99)
Glucose, Bld: 106 mg/dL — ABNORMAL HIGH (ref 65–99)
POTASSIUM: 5.7 mmol/L — AB (ref 3.5–5.1)
Potassium: 6 mmol/L — ABNORMAL HIGH (ref 3.5–5.1)
Potassium: 5.7 mmol/L — ABNORMAL HIGH (ref 3.5–5.1)
SODIUM: 136 mmol/L (ref 135–145)
SODIUM: 141 mmol/L (ref 135–145)
Sodium: 144 mmol/L (ref 135–145)

## 2018-05-12 LAB — GLUCOSE, CAPILLARY
GLUCOSE-CAPILLARY: 105 mg/dL — AB (ref 65–99)
GLUCOSE-CAPILLARY: 108 mg/dL — AB (ref 65–99)
GLUCOSE-CAPILLARY: 125 mg/dL — AB (ref 65–99)
GLUCOSE-CAPILLARY: 81 mg/dL (ref 65–99)
GLUCOSE-CAPILLARY: 84 mg/dL (ref 65–99)
GLUCOSE-CAPILLARY: 87 mg/dL (ref 65–99)
Glucose-Capillary: 14 mg/dL — CL (ref 65–99)
Glucose-Capillary: 50 mg/dL — ABNORMAL LOW (ref 65–99)

## 2018-05-12 LAB — RETICULOCYTES
RBC.: 3.26 MIL/uL — AB (ref 3.87–5.11)
RETIC COUNT ABSOLUTE: 68.5 10*3/uL (ref 19.0–186.0)
RETIC CT PCT: 2.1 % (ref 0.4–3.1)

## 2018-05-12 LAB — IRON AND TIBC
Iron: 39 ug/dL (ref 28–170)
Saturation Ratios: 17 % (ref 10.4–31.8)
TIBC: 229 ug/dL — ABNORMAL LOW (ref 250–450)
UIBC: 190 ug/dL

## 2018-05-12 LAB — HEMOGLOBIN AND HEMATOCRIT, BLOOD
HEMATOCRIT: 30.8 % — AB (ref 36.0–46.0)
Hemoglobin: 10.1 g/dL — ABNORMAL LOW (ref 12.0–15.0)

## 2018-05-12 LAB — FERRITIN: Ferritin: 174 ng/mL (ref 11–307)

## 2018-05-12 LAB — OCCULT BLOOD, POC DEVICE: Fecal Occult Bld: POSITIVE — AB

## 2018-05-12 LAB — FOLATE: FOLATE: 22.1 ng/mL (ref >5.9)

## 2018-05-12 LAB — PREPARE RBC (CROSSMATCH)

## 2018-05-12 LAB — VITAMIN B12: VITAMIN B 12: 429 pg/mL (ref 180–914)

## 2018-05-12 MED ORDER — DEXTROSE 50 % IV SOLN
50.0000 mL | Freq: Once | INTRAVENOUS | Status: AC
  Administered 2018-05-12: 50 mL via INTRAVENOUS

## 2018-05-12 MED ORDER — DEXTROSE 50 % IV SOLN: 25.0000 mL | Freq: Once | INTRAVENOUS | Status: AC

## 2018-05-12 MED ORDER — SODIUM CHLORIDE 0.9 % IV SOLN
Freq: Once | INTRAVENOUS | Status: AC
  Administered 2018-05-12: 11:00:00 via INTRAVENOUS

## 2018-05-12 MED ORDER — SODIUM CHLORIDE 0.9 % IV SOLN
INTRAVENOUS | Status: DC
  Administered 2018-05-12 – 2018-05-13 (×3): via INTRAVENOUS

## 2018-05-12 MED ORDER — SODIUM POLYSTYRENE SULFONATE 15 GM/60ML PO SUSP
30.0000 g | Freq: Once | ORAL | Status: AC
  Administered 2018-05-12: 30 g via ORAL
  Filled 2018-05-12: qty 120

## 2018-05-12 MED ORDER — DEXTROSE 50 % IV SOLN
INTRAVENOUS | Status: AC
  Administered 2018-05-12: 50 mL
  Filled 2018-05-12: qty 50

## 2018-05-12 NOTE — Progress Notes (Signed)
Hypoglycemic Event  CBG: 50  Treatment: D50 IV 25 mL  Symptoms: None  Follow-up CBG: Time:0439 CBG Result:125  Possible Reasons for Event: Inadequate meal intake  Comments/MD notified: On call MD made aware; initiated hypoglycemic protocol. Verbal orders obtained to give 50 mL of D50 and check BMP/labs 30 minutes after. RN communicated with lab phlebotomist about getting a BMP 30 mins after D50 was given. Will continue to monitor.     Melanny Wire Nanine MeansM Cesiah Westley

## 2018-05-12 NOTE — Progress Notes (Signed)
Patient's Grand-daughter Shari Golden called to get a telephone consent for blood transfusion and foley placement due to retention.

## 2018-05-12 NOTE — Progress Notes (Addendum)
Shari Golden   JXB:[id number]  DOB: 08/17/1930  DOA: 05/11/2018 PCP: Richardean Chimera, MD   Brief Narrative:  Shari Golden is a 84 y/o with A-fib, HTN, DM2, alzheimer's dementia, CKD3, CAD who is brought by her daughter for confusion, not answering questions and falling which started on Friday. At baseline the patient is noted to answer briefly to questions and is able to feed herself. However, over the past few days the patient has been minimally responsive, has not recognized those around him, and has been agitated. The patient had a fall on Wednesday and fell forward in her chair. The patient also fell backward on the morning of admission when her family members were trying to help her get dressed and hit her head.   The patient was recently seen in the [facility] ED on 04/29/18 for urinary tract infection that was treated with bactrim 800-160mg  once.   In ED: K 6.4, BUN 42/CR 3.82 up from 39 and 2.45 on 5/21.  Hb 7.7 dropping from 10/4 on 5/21.  Subjective: Lethargic and non communicative. I have spoken with her POA, [name] who is her granddaughter and a Engineer, civil (consulting).  Assessment & Plan:   Principal Problem:   Acute encephalopathy - sleepy in AM- per grand daughtr she was awake for abut 48 hrs and may be exhausted - ? Due to dehydration and azotemia - CT head negative - follow  Active Problems:   AKI (acute kidney injury)- CKD 3 - volume loss due to poor oral intake in past few day but also possibly due to blood loss and bladder outlet obstruction - cont to hydrate - increase IVF rate from 75 to 125  Anorexia - weight was 119 lb in April- now 109 due to poor appetite- - no appetite stimulant tried yet- we can try one when she awakens per POA  Hyperkalemia  - given Ca gluconate, D50 and Insulin but became hypoglycemic - due to AKI- K down from 6.4 from 5.7- cont to hydrate- may go up after blood - recheck  U retention - > 300 cc obtained with I and O caht  overnight and again this AM after foley placed  Anemia- FOB + - Hb 7.7 dropping from 10.4 on 5/21 - check anemia panel - transfuse 1 U PRBC - no blood loss noted at hom - POA does not want an endocopy    Hypertension/ Atrial fibrillation  - apparently has a remote history of A-fib - IV Lopressor in stead of Toprol XL 25 mg while lethargic - Cardizem 360 mg on hold    Type II diabetes mellitus  - on Lantus 8 u at home which is on hold - her grand daugther was giving it to her based on a sliding scale and holding it at times - SSI in hospital  Multinodular goiter  - noted on imaging - check TFTs    DVT prophylaxis: Lovenox Code Status: DNR Family Communication: [name], POA Disposition Plan: follow on med surg- if she continues to have a poor appetite despite starting appetite stimulant, would recommend palliative care Consultants:   none Procedures:    Antimicrobials:  Anti-infectives (From admission, onward)   None       Objective: Vitals:   05/12/18 0417 05/12/18 1114 05/12/18 1140 05/12/18 1241  BP: (!) 135/57 (!) 147/57 (!) 156/56 (!) 156/62  Pulse: 67 70 71 69  Resp: 16 16 16 18   Temp: 97.8 F (36.6 C) 98.2 F (  36.8 C) 98.5 F (36.9 C) 98.3 F (36.8 C)  TempSrc: Oral Oral Oral Oral  SpO2: 97% 97% 100% 100%  Weight:      Height:        Intake/Output Summary (Last 24 hours) at 05/12/2018 1412 Last data filed at 05/12/2018 1140 Gross per 24 hour  Intake 922.5 ml  Output 575 ml  Net 347.5 ml   Filed Weights   05/11/18 2100  Weight: 49.7 kg (109 lb 9.1 oz)    Examination: General exam: Appears comfortable - lethargic HEENT: PERRLA, oral mucosa moist, no sclera icterus or thrush Respiratory system: Clear to auscultation. Respiratory effort normal. Cardiovascular system: S1 & S2 heard, RRR.   Gastrointestinal system: Abdomen soft, non-tender, nondistended. Normal bowel sound. No organomegaly Extremities: No cyanosis, clubbing or edema Skin: No  rashes or ulcers     Data Reviewed: I have personally reviewed following labs and imaging studies  CBC: Recent Labs  Lab 05/11/18 1653 05/11/18 2159 05/12/18 0215 05/12/18 0709  WBC 7.4 6.1 5.0 4.3  NEUTROABS 6.1  --   --   --   HGB 7.7* 7.6* 7.0* 7.0*  HCT 23.5* 23.0* 21.6* 20.9*  MCV 92.9 93.5 93.5 93.3  PLT 220 208 202 191   Basic Metabolic Panel: Recent Labs  Lab 05/11/18 1653 05/12/18 0709  NA 139 136  K 6.4* 5.7*  CL 109 110  CO2 21* 19*  GLUCOSE 112* 118*  BUN 42* 36*  CREATININE 3.82* 3.22*  CALCIUM 9.4 8.8*   GFR: Estimated Creatinine Clearance: 9.8 mL/min (A) (by C-G formula based on SCr of 3.22 mg/dL (H)). Liver Function Tests: Recent Labs  Lab 05/11/18 1653 05/12/18 0709  AST 39 33  ALT 25 21  ALKPHOS 92 80  BILITOT 0.7 0.7  PROT 7.2 6.0*  ALBUMIN 4.1 3.2*   No results for input(s): LIPASE, AMYLASE in the last 168 hours. Recent Labs  Lab 05/11/18 2159  AMMONIA 14   Coagulation Profile: No results for input(s): INR, PROTIME in the last 168 hours. Cardiac Enzymes: Recent Labs  Lab 05/11/18 1650  TROPONINI <0.03   BNP (last 3 results) No results for input(s): PROBNP in the last 8760 hours. HbA1C: No results for input(s): HGBA1C in the last 72 hours. CBG: Recent Labs  Lab 05/12/18 0038 05/12/18 0413 05/12/18 0439 05/12/18 0737 05/12/18 1124  GLUCAP 108* 50* 125* 105* 81   Lipid Profile: No results for input(s): CHOL, HDL, LDLCALC, TRIG, CHOLHDL, LDLDIRECT in the last 72 hours. Thyroid Function Tests: No results for input(s): TSH, T4TOTAL, FREET4, T3FREE, THYROIDAB in the last 72 hours. Anemia Panel: No results for input(s): VITAMINB12, FOLATE, FERRITIN, TIBC, IRON, RETICCTPCT in the last 72 hours. Urine analysis:    Component Value Date/Time   COLORURINE YELLOW 05/11/2018 1822   APPEARANCEUR CLEAR 05/11/2018 1822   LABSPEC 1.013 05/11/2018 1822   PHURINE 5.0 05/11/2018 1822   GLUCOSEU NEGATIVE 05/11/2018 1822   HGBUR  NEGATIVE 05/11/2018 1822   BILIRUBINUR NEGATIVE 05/11/2018 1822   KETONESUR NEGATIVE 05/11/2018 1822   PROTEINUR 30 (A) 05/11/2018 1822   NITRITE NEGATIVE 05/11/2018 1822   LEUKOCYTESUR NEGATIVE 05/11/2018 1822   Sepsis Labs: @LABRCNTIP (procalcitonin:4,lacticidven:4) )No results found for this or any previous visit (from the past 240 hour(s)).       Radiology Studies: Ct Head Wo Contrast  Result Date: 05/11/2018 CLINICAL DATA:  Worsening confusion. Recent fall with head injury. Recent urinary tract infection. History of dementia. EXAM: CT HEAD WITHOUT CONTRAST CT CERVICAL SPINE WITHOUT  CONTRAST TECHNIQUE: Multidetector CT imaging of the head and cervical spine was performed following the standard protocol without intravenous contrast. Multiplanar CT image reconstructions of the cervical spine were also generated. COMPARISON:  Brain MRI 04/30/2016 FINDINGS: CT HEAD FINDINGS Brain: There is no evidence of acute large territory infarct, intracranial hemorrhage, mass, midline shift, or extra-axial fluid collection. Mild cerebral atrophy is within normal limits for age. Periventricular white matter hypodensities are nonspecific but compatible with minimal for age chronic small vessel ischemic disease. Vascular: Calcified atherosclerosis at the skull base. No gross hyperdense vessel. Skull: No fracture focal osseous lesion. Sinuses/Orbits: Visualized paranasal sinuses and mastoid air cells are clear. Bilateral cataract extraction is noted. Other: None. CT CERVICAL SPINE FINDINGS Alignment: Cervical spine straightening. Trace retrolisthesis of C5 on C6, likely degenerative. Skull base and vertebrae: No acute fracture or suspicious osseous lesion. Soft tissues and spinal canal: No prevertebral fluid or swelling. No visible canal hematoma. Disc levels: Focally advanced disc degeneration at C5-6 including severe disc space narrowing, endplate sclerosis, and spurring resulting in mild left greater than right  neural foraminal stenosis. Mild multilevel left-sided cervical facet arthrosis. No significant osseous spinal canal stenosis. Upper chest: Clear lung apices. Other: Left greater than right thyroid lobe enlargement and heterogeneity diffusely with evidence of multiple small underlying nodules compatible with multinodular goiter. Moderate right and extensive left carotid bifurcation calcified atherosclerosis. IMPRESSION: No evidence of acute intracranial abnormality or cervical spine fracture. Electronically Signed   By: Sebastian AcheAllen  Grady M.D.   On: 05/11/2018 18:13   Ct Cervical Spine Wo Contrast  Result Date: 05/11/2018 CLINICAL DATA:  Worsening confusion. Recent fall with head injury. Recent urinary tract infection. History of dementia. EXAM: CT HEAD WITHOUT CONTRAST CT CERVICAL SPINE WITHOUT CONTRAST TECHNIQUE: Multidetector CT imaging of the head and cervical spine was performed following the standard protocol without intravenous contrast. Multiplanar CT image reconstructions of the cervical spine were also generated. COMPARISON:  Brain MRI 04/30/2016 FINDINGS: CT HEAD FINDINGS Brain: There is no evidence of acute large territory infarct, intracranial hemorrhage, mass, midline shift, or extra-axial fluid collection. Mild cerebral atrophy is within normal limits for age. Periventricular white matter hypodensities are nonspecific but compatible with minimal for age chronic small vessel ischemic disease. Vascular: Calcified atherosclerosis at the skull base. No gross hyperdense vessel. Skull: No fracture focal osseous lesion. Sinuses/Orbits: Visualized paranasal sinuses and mastoid air cells are clear. Bilateral cataract extraction is noted. Other: None. CT CERVICAL SPINE FINDINGS Alignment: Cervical spine straightening. Trace retrolisthesis of C5 on C6, likely degenerative. Skull base and vertebrae: No acute fracture or suspicious osseous lesion. Soft tissues and spinal canal: No prevertebral fluid or swelling. No  visible canal hematoma. Disc levels: Focally advanced disc degeneration at C5-6 including severe disc space narrowing, endplate sclerosis, and spurring resulting in mild left greater than right neural foraminal stenosis. Mild multilevel left-sided cervical facet arthrosis. No significant osseous spinal canal stenosis. Upper chest: Clear lung apices. Other: Left greater than right thyroid lobe enlargement and heterogeneity diffusely with evidence of multiple small underlying nodules compatible with multinodular goiter. Moderate right and extensive left carotid bifurcation calcified atherosclerosis. IMPRESSION: No evidence of acute intracranial abnormality or cervical spine fracture. Electronically Signed   By: Sebastian AcheAllen  Grady M.D.   On: 05/11/2018 18:13   Dg Pelvis Portable  Result Date: 05/12/2018 CLINICAL DATA:  Pain following fall EXAM: PORTABLE PELVIS 1-2 VIEWS COMPARISON:  None. FINDINGS: There is no evidence of pelvic fracture or dislocation. There is mild symmetric narrowing of both hip  joints. No erosive change. IMPRESSION: Mild symmetric narrowing of both hip joints. No acute fracture or dislocation. Electronically Signed   By: Bretta Bang III M.D.   On: 05/12/2018 07:56   Dg Chest Port 1 View  Result Date: 05/12/2018 CLINICAL DATA:  Fall EXAM: PORTABLE CHEST 1 VIEW COMPARISON:  04/29/2018 FINDINGS: Heart size is normal. There is atherosclerosis and tortuosity of the aorta. There is mild atelectasis in the left lower lobe. Densities previously shot on the right appear to correlate with anterior rib fractures. No evidence of heart failure or effusion. Unhealed fracture noted of the left posterior 6 and seventh ribs, though not acute today. IMPRESSION: Mild left lower lobe atelectasis. Electronically Signed   By: Paulina Fusi M.D.   On: 05/12/2018 08:01      Scheduled Meds: . mouth rinse  15 mL Mouth Rinse BID  . metoprolol tartrate  2.5 mg Intravenous Q8H   Continuous Infusions: . sodium  chloride Stopped (05/12/18 1129)     LOS: 0 days    Time spent in minutes: 35    Calvert Cantor, MD [facility] Pager: [email or web address] Password [email or web address] 05/12/2018, 2:12 PM

## 2018-05-12 NOTE — Progress Notes (Signed)
Hypoglycemic Event  CBG: 14  Treatment: D50 IV 50 mL  Symptoms: Sweaty  Follow-up CBG: Time:0038 CBG Result:108  Possible Reasons for Event: Inadequate meal intake  Comments/MD notified: Initiated hypoglycemic protocol. MD on call notified     Jesslyn Viglione, Dwana MelenaChloe Y

## 2018-05-12 NOTE — Progress Notes (Signed)
Patient did not void during the night. Showed 493 mL upon bladder scan. On call MD paged and verbal order obtained to in and out cath, recheck bladder scan in 3 hours, and place catheter if patient is still retaining.

## 2018-05-13 DIAGNOSIS — Z515 Encounter for palliative care: Secondary | ICD-10-CM

## 2018-05-13 DIAGNOSIS — Z7189 Other specified counseling: Secondary | ICD-10-CM | POA: Diagnosis not present

## 2018-05-13 DIAGNOSIS — N183 Chronic kidney disease, stage 3 (moderate): Secondary | ICD-10-CM | POA: Diagnosis not present

## 2018-05-13 DIAGNOSIS — E042 Nontoxic multinodular goiter: Secondary | ICD-10-CM | POA: Diagnosis not present

## 2018-05-13 DIAGNOSIS — D62 Acute posthemorrhagic anemia: Secondary | ICD-10-CM | POA: Diagnosis not present

## 2018-05-13 DIAGNOSIS — I480 Paroxysmal atrial fibrillation: Secondary | ICD-10-CM | POA: Diagnosis not present

## 2018-05-13 DIAGNOSIS — R4182 Altered mental status, unspecified: Secondary | ICD-10-CM

## 2018-05-13 DIAGNOSIS — G309 Alzheimer's disease, unspecified: Secondary | ICD-10-CM | POA: Diagnosis not present

## 2018-05-13 DIAGNOSIS — G9349 Other encephalopathy: Secondary | ICD-10-CM | POA: Diagnosis not present

## 2018-05-13 LAB — CBC
HEMATOCRIT: 28.6 % — AB (ref 36.0–46.0)
HEMOGLOBIN: 9.2 g/dL — AB (ref 12.0–15.0)
MCH: 29.8 pg (ref 26.0–34.0)
MCHC: 32.2 g/dL (ref 30.0–36.0)
MCV: 92.6 fL (ref 78.0–100.0)
Platelets: 213 10*3/uL (ref 150–400)
RBC: 3.09 MIL/uL — AB (ref 3.87–5.11)
RDW: 16.1 % — AB (ref 11.5–15.5)
WBC: 4.8 10*3/uL (ref 4.0–10.5)

## 2018-05-13 LAB — BASIC METABOLIC PANEL
ANION GAP: 8 (ref 5–15)
BUN: 31 mg/dL — ABNORMAL HIGH (ref 6–20)
CALCIUM: 8.9 mg/dL (ref 8.9–10.3)
CO2: 20 mmol/L — ABNORMAL LOW (ref 22–32)
Chloride: 121 mmol/L — ABNORMAL HIGH (ref 101–111)
Creatinine, Ser: 2.57 mg/dL — ABNORMAL HIGH (ref 0.44–1.00)
GFR, EST AFRICAN AMERICAN: 18 mL/min — AB (ref >60)
GFR, EST NON AFRICAN AMERICAN: 16 mL/min — AB (ref >60)
Glucose, Bld: 84 mg/dL (ref 65–99)
POTASSIUM: 5.4 mmol/L — AB (ref 3.5–5.1)
SODIUM: 149 mmol/L — AB (ref 135–145)

## 2018-05-13 LAB — GLUCOSE, CAPILLARY
GLUCOSE-CAPILLARY: 236 mg/dL — AB (ref 65–99)
Glucose-Capillary: 154 mg/dL — ABNORMAL HIGH (ref 65–99)
Glucose-Capillary: 208 mg/dL — ABNORMAL HIGH (ref 65–99)
Glucose-Capillary: 72 mg/dL (ref 65–99)
Glucose-Capillary: 77 mg/dL (ref 65–99)
Glucose-Capillary: 84 mg/dL (ref 65–99)

## 2018-05-13 LAB — T4, FREE: Free T4: 0.72 ng/dL — ABNORMAL LOW (ref 0.82–1.77)

## 2018-05-13 LAB — TYPE AND SCREEN
ABO/RH(D): O NEG
Antibody Screen: NEGATIVE
Unit division: 0

## 2018-05-13 LAB — URINE CULTURE: CULTURE: NO GROWTH

## 2018-05-13 LAB — BPAM RBC
BLOOD PRODUCT EXPIRATION DATE: 201907052359
ISSUE DATE / TIME: 201906031119
UNIT TYPE AND RH: 9500

## 2018-05-13 LAB — TSH: TSH: 0.638 u[IU]/mL (ref 0.350–4.500)

## 2018-05-13 MED ORDER — SERTRALINE HCL 100 MG PO TABS
100.0000 mg | ORAL_TABLET | Freq: Every day | ORAL | Status: DC
  Administered 2018-05-13: 100 mg via ORAL
  Filled 2018-05-13: qty 1

## 2018-05-13 MED ORDER — DEXTROSE 5 % IV SOLN
INTRAVENOUS | Status: DC
  Administered 2018-05-13 – 2018-05-14 (×2): via INTRAVENOUS

## 2018-05-13 MED ORDER — INSULIN ASPART 100 UNIT/ML ~~LOC~~ SOLN
0.0000 [IU] | Freq: Three times a day (TID) | SUBCUTANEOUS | Status: DC
  Administered 2018-05-13 (×2): 3 [IU] via SUBCUTANEOUS
  Administered 2018-05-14: 2 [IU] via SUBCUTANEOUS
  Administered 2018-05-14: 1 [IU] via SUBCUTANEOUS

## 2018-05-13 MED ORDER — RENA-VITE PO TABS
1.0000 | ORAL_TABLET | Freq: Every day | ORAL | Status: DC
  Administered 2018-05-13: 1 via ORAL
  Filled 2018-05-13: qty 1

## 2018-05-13 MED ORDER — DILTIAZEM HCL ER COATED BEADS 180 MG PO CP24
360.0000 mg | ORAL_CAPSULE | Freq: Every day | ORAL | Status: DC
  Administered 2018-05-13 – 2018-05-14 (×2): 360 mg via ORAL
  Filled 2018-05-13 (×2): qty 2

## 2018-05-13 MED ORDER — INSULIN GLARGINE 100 UNIT/ML ~~LOC~~ SOLN
8.0000 [IU] | Freq: Every day | SUBCUTANEOUS | Status: DC
  Administered 2018-05-13 – 2018-05-14 (×2): 8 [IU] via SUBCUTANEOUS
  Filled 2018-05-13 (×2): qty 0.08

## 2018-05-13 MED ORDER — METOPROLOL SUCCINATE ER 25 MG PO TB24
25.0000 mg | ORAL_TABLET | Freq: Every day | ORAL | Status: DC
  Administered 2018-05-13 – 2018-05-14 (×2): 25 mg via ORAL
  Filled 2018-05-13 (×2): qty 1

## 2018-05-13 MED ORDER — MEGESTROL ACETATE 400 MG/10ML PO SUSP
400.0000 mg | Freq: Two times a day (BID) | ORAL | Status: DC
  Administered 2018-05-13 – 2018-05-14 (×3): 400 mg via ORAL
  Filled 2018-05-13 (×3): qty 10

## 2018-05-13 MED ORDER — ENSURE ENLIVE PO LIQD
237.0000 mL | Freq: Two times a day (BID) | ORAL | Status: DC
  Administered 2018-05-13 – 2018-05-14 (×2): 237 mL via ORAL

## 2018-05-13 MED ORDER — PANTOPRAZOLE SODIUM 40 MG PO TBEC
40.0000 mg | DELAYED_RELEASE_TABLET | Freq: Every day | ORAL | Status: DC
  Administered 2018-05-13: 40 mg via ORAL
  Filled 2018-05-13: qty 1

## 2018-05-13 NOTE — Progress Notes (Signed)
Initial Nutrition Assessment  DOCUMENTATION CODES:   Underweight, Severe malnutrition in context of chronic illness  INTERVENTION:    Ensure Enlive po BID, each supplement provides 350 kcal and 20 grams of protein  Magic cup TID with meals, each supplement provides 290 kcal and 9 grams of protein  NUTRITION DIAGNOSIS:   Severe Malnutrition related to chronic illness(dementia) as evidenced by severe fat depletion, severe muscle depletion.  GOAL:   Patient will meet greater than or equal to 90% of their needs  MONITOR:   PO intake, Supplement acceptance, Weight trends, Labs  REASON FOR ASSESSMENT:   Malnutrition Screening Tool    ASSESSMENT:   Patient with PMH significant for A-fib, HTN, DM, alzheimer's dementia, CKD III, and CAD. Presents this admission with complaints of confusion resulting in a mechanical fall. Found to have acute encephalopathy possible from dehydration and azotemia.    No family at bedside to provide nutrition history. Will attempt to talk with them at a later date if possible. Weight records show a stated weight of 140 lb on 04/29/18 and an actual weight of 111 lb this admission. Unable to quantify weight loss with history provide but suspect pt has lost a significant amount of weight.   Spoke with tech regarding intake this admission. Pt's diet was advanced from NPO to regular this morning. MD added Megace to help stimulate appetite. Pt consumed 50% of eggs, grits, toast, and orange juice this morning. Tech reports pt did not show signs of swallowing difficulty. RD to add Ensure to maximize calories and protein while in the hospital. Nutrition-Focused physical exam completed.   Medications reviewed and include: megace, rena-vit Labs reviewed: Na 149 (H) K 5.4 (H)   NUTRITION - FOCUSED PHYSICAL EXAM:    Most Recent Value  Orbital Region  Moderate depletion  Upper Arm Region  Severe depletion  Thoracic and Lumbar Region  Unable to assess  Buccal  Region  Severe depletion  Temple Region  Severe depletion  Clavicle Bone Region  Severe depletion  Clavicle and Acromion Bone Region  Severe depletion  Scapular Bone Region  Unable to assess  Dorsal Hand  Severe depletion  Patellar Region  Severe depletion  Anterior Thigh Region  Severe depletion  Posterior Calf Region  Severe depletion  Edema (RD Assessment)  None  Hair  Reviewed  Eyes  Reviewed  Mouth  Reviewed  Skin  Reviewed  Nails  Reviewed     Diet Order:   Diet Order           Diet regular Room service appropriate? Yes; Fluid consistency: Thin  Diet effective now          EDUCATION NEEDS:   Not appropriate for education at this time  Skin:  Skin Assessment: Reviewed RN Assessment  Last BM:  PTA  Height:   Ht Readings from Last 1 Encounters:  05/11/18 5\' 7"  (1.702 m)    Weight:   Wt Readings from Last 1 Encounters:  05/13/18 111 lb 5.3 oz (50.5 kg)    Ideal Body Weight:  59.1 kg  BMI:  Body mass index is 17.44 kg/m.  Estimated Nutritional Needs:   Kcal:  1500-1700 kcal  Protein:  75-85 g  Fluid:  >1.5 L/day    Shari Golden RD, Shari Golden Clinical Nutrition Pager # [phone]

## 2018-05-13 NOTE — Consult Note (Addendum)
Consultation Note Date: 05/13/2018   Patient Name: Shari Golden  DOB: 1930/02/20  MRN: [id number]  Age / Sex: 84 y.o., female  PCP: Richardean Chimeraaniel, Terry G, MD Referring Physician: Calvert Cantorizwan, Saima, MD  Reason for Consultation: Establishing goals of care  HPI/Patient Profile: Shari MusselLaura L Daffin is a 84 y/o with A-fib, HTN, DM2, alzheimer's dementia, CKD3, CAD who is brought by her daughter for confusion, not answering questions and falling which started on Friday.   Clinical Assessment and Goals of Care: Patient sitting in bed. She is alert to self, and has dementia at baseline. Dinner tray is in room with a few bites eaten.  Daughter is at bedside. Ms. Normand SloopDillard lives with her daughter Victorino DikeJennifer, who is her next of kin and only child. She is the American Family Insurancematriarch of 4 generations following her.   Victorino DikeJennifer states 2 years ago she was doing pretty well, but moved from her long time apartment. Following that move, she became more confused and required more assistance with activities. She has an aide that comes into the home daily to help.    She states over the last few weeks, she has began pacing, and staggering with trying to walk. She scratches at her daughter when she is angry. She urinates in the trash or pantry and has begun to wear depends. She stopped eating, and has lost 10 pounds in the last month. She sucks on her pills like candy instead of swallowing. She forgets what to do with her dentures.   Victorino DikeJennifer states she believes she is ready to allow her mother to go home to be with Jesus. She states her mother is living in hell not knowing what is going on, and where every day is new.  She states she would like to speak with her family about comfort care and hospice this evening and would like to re-meet tomorrow at 9:00.   At this time, she would like to treat the treatable. DNR in place. Will re-meet tomorrow at 9:00am.        SUMMARY OF RECOMMENDATIONS    Treat the treatable. Will revisit tomorrow at 9:00. Leaning toward comfort care and hospice.   Code Status/Advance Care Planning:  DNR    Symptom Management:   Per primary team.   Palliative Prophylaxis:   Eye Care and Oral Care  Prognosis:   Poor. CKD. Poor PO intake with weight loss of 10 pounds in 1 month.  Dementia. Fall.  Blood transfusion this hospitalization.   Discharge Planning: To Be Determined      Primary Diagnoses: Present on Admission: . Acute encephalopathy . Acute blood loss anemia . Hypertension . (Resolved) ARF (acute renal failure) (HCC) . Atrial fibrillation (HCC) . Essential hypertension . Controlled diabetes mellitus type 2 with complications (HCC) . CKD (chronic kidney disease), stage III (HCC)   I have reviewed the medical record, interviewed the patient and family, and examined the patient. The following aspects are pertinent.  Past Medical History:  Diagnosis Date  . Anemia   . Anxiety   .  Arthritis   . Carotid artery disease (HCC)    a. 12/2015 Carotid u/s: 1-39% bilat ICA stenosis-->f/u prn.  . CKD (chronic kidney disease), stage III (HCC)    Dr. Fausto Skillern  . Constipation   . Essential hypertension   . GERD (gastroesophageal reflux disease)   . History of cardiac catheterization    a. 2004 Cath: Normal coronaries.  Marland Kitchen PAF (paroxysmal atrial fibrillation) (HCC)    a. Remotely documented without recurrence.  . Peripheral neuropathy   . Type II diabetes mellitus (HCC)    a. 01/2017 HbA1c 7.0.   Social History   Socioeconomic History  . Marital status: Widowed    Spouse name: Not on file  . Number of children: Not on file  . Years of education: Not on file  . Highest education level: Not on file  Occupational History  . Not on file  Social Needs  . Financial resource strain: Not on file  . Food insecurity:    Worry: Not on file    Inability: Not on file  . Transportation needs:     Medical: Not on file    Non-medical: Not on file  Tobacco Use  . Smoking status: Never Smoker  . Smokeless tobacco: Former Neurosurgeon    Types: Snuff  . Tobacco comment: 04/01/2013 "smoked 3 cigarettes in 1956; stopped; quit dipping > 20 yr ago"  Substance and Sexual Activity  . Alcohol use: No    Alcohol/week: 0.0 oz  . Drug use: No  . Sexual activity: Never  Lifestyle  . Physical activity:    Days per week: Not on file    Minutes per session: Not on file  . Stress: Not on file  Relationships  . Social connections:    Talks on phone: Not on file    Gets together: Not on file    Attends religious service: Not on file    Active member of club or organization: Not on file    Attends meetings of clubs or organizations: Not on file    Relationship status: Not on file  Other Topics Concern  . Not on file  Social History Narrative  . Not on file   Family History  Problem Relation Age of Onset  . Diabetes Sister   . Heart disease Sister   . Prostate cancer Father   . Stroke Neg Hx   . Coronary artery disease Neg Hx    Scheduled Meds: . diltiazem  360 mg Oral Daily  . feeding supplement (ENSURE ENLIVE)  237 mL Oral BID BM  . insulin aspart  0-9 Units Subcutaneous TID WC  . insulin glargine  8 Units Subcutaneous Daily  . mouth rinse  15 mL Mouth Rinse BID  . megestrol  400 mg Oral BID  . metoprolol succinate  25 mg Oral Daily  . multivitamin  1 tablet Oral QHS  . pantoprazole  40 mg Oral QHS  . sertraline  100 mg Oral QHS   Continuous Infusions: . dextrose 110 mL/hr at 05/13/18 0900   PRN Meds:.acetaminophen **OR** acetaminophen, ondansetron **OR** ondansetron (ZOFRAN) IV Medications Prior to Admission:  Prior to Admission medications   Medication Sig Start Date End Date Taking? Authorizing Provider  acetaminophen (TYLENOL) 325 MG tablet Take 650 mg by mouth every 6 (six) hours as needed for moderate pain.    Yes [provider]  diltiazem (CARDIZEM CD) 360 MG 24 hr  capsule Take 360 mg by mouth daily.   Yes [provider]  insulin glargine (LANTUS) 100 UNIT/ML injection Inject 8 Units into the skin daily. Sliding scale   Yes [provider]  metoprolol succinate (TOPROL-XL) 25 MG 24 hr tablet TAKE 1 TABLET BY MOUTH ONCE DAILY 03/28/18  Yes Chilton Si, MD  multivitamin (RENA-VIT) TABS tablet Take 1 tablet by mouth at bedtime.    Yes [provider]  pantoprazole (PROTONIX) 40 MG tablet Take 40 mg by mouth at bedtime.    Yes [provider]  sertraline (ZOLOFT) 100 MG tablet Take 100 mg by mouth at bedtime.  02/19/17  Yes Richardean Chimera, MD  traMADol-acetaminophen (ULTRACET) 37.5-325 MG tablet Take 1 tablet by mouth every 6 (six) hours as needed. Patient taking differently: Take 1 tablet by mouth every 6 (six) hours as needed for moderate pain.  04/29/18  Yes Mancel Bale, MD   Allergies  Allergen Reactions  . Erythromycin Other (See Comments)    REACTION: Dehydration  . Iodine Itching, Rash and Other (See Comments)    REACTION: dizzy  . Iohexol Other (See Comments)     Code: HIVES, Desc: IODINE BASED CONTRAST   . Latex Swelling and Other (See Comments)    REACTION: Swelling (If touched on face will have facial swelling)  . Penicillins Itching and Rash    Has patient had a PCN reaction causing immediate rash, facial/tongue/throat swelling, SOB or lightheadedness with hypotension: unknown Has patient had a PCN reaction causing severe rash involving mucus membranes or skin necrosis: Unknown Has patient had a PCN reaction that required hospitalization: Unknown Has patient had a PCN reaction occurring within the last 10 years: Unknown If all of the above answers are "NO", then may proceed with Cephalosporin use.    Review of Systems  Unable to perform ROS   Physical Exam  Constitutional: No distress.  Pulmonary/Chest: Effort normal.  Neurological: She is alert.  Skin: Skin is warm and dry.    Vital  Signs: BP (!) 168/80 (BP Location: Left Arm)   Pulse 72   Temp 98.7 F (37.1 C) (Oral)   Resp 18   Ht 5\' 7"  (1.702 m)   Wt 50.5 kg (111 lb 5.3 oz)   SpO2 97%   BMI 17.44 kg/m  Pain Scale: 0-10   Pain Score: Asleep   SpO2: SpO2: 97 % O2 Device:SpO2: 97 % O2 Flow Rate: .   IO: Intake/output summary:   Intake/Output Summary (Last 24 hours) at 05/13/2018 1542 Last data filed at 05/13/2018 1500 Gross per 24 hour  Intake 1817.92 ml  Output 950 ml  Net 867.92 ml    LBM: Last BM Date: (Unable to assess.) Baseline Weight: Weight: 49.7 kg (109 lb 9.1 oz) Most recent weight: Weight: 50.5 kg (111 lb 5.3 oz)     Palliative Assessment/Data: 40%     Time In: 2:50 Time Out: 4:00 Time Total: 70 min Greater than 50%  of this time was spent counseling and coordinating care related to the above assessment and plan.  Signed by: Morton Stall, NP   Please contact Palliative Medicine Team phone at [phone] for questions and concerns.  For individual provider: See Loretha Stapler

## 2018-05-13 NOTE — Progress Notes (Addendum)
PROGRESS NOTE    Shari Golden   ZOX:[id number]  DOB: 1930-10-24  DOA: 05/11/2018 PCP: Richardean Chimera, MD   Brief Narrative:  Shari Golden is a 84 y/o with A-fib, HTN, DM2, alzheimer's dementia, CKD3, CAD who is brought by her daughter for confusion, not answering questions and falling which started on Friday. At baseline the patient is noted to answer briefly to questions and is able to feed herself. However, over the past few days the patient has been minimally responsive, has not recognized those around him, and has been agitated. The patient had a fall on Wednesday and fell forward in her chair. The patient also fell backward on the morning of admission when her family members were trying to help her get dressed and hit her head.   The patient was recently seen in the [facility] on 04/29/18 for urinary tract infection that was treated with bactrim 800-160mg  once.   In ED: K 6.4, BUN 42/CR 3.82 up from 39 and 2.45 on 5/21.  Hb 7.7 dropping from 10/4 on 5/21.  Subjective: Alert today but appears quite weak. No appetite. No pain and no other complaints.   Assessment & Plan:   Principal Problem:   Acute encephalopathy - sleepy in AM- per grand daughtr she was awake for abut 48 hrs and may be exhausted - ? Due to dehydration and azotemia - CT head negative -- improving with hydration  Active Problems:   AKI (acute kidney injury)- CKD 3 - volume loss due to poor oral intake in past few day but also possibly due to blood loss and bladder outlet obstruction - cont to hydrate - increase IVF rate from 75 to 125 - 6/4- improving- change to D5 as sodium is 149 and Chloride is 121 and I doubt 1/2 NS will help much  Anorexia - weight was 119 lb in April- now 109 due to poor appetite- - no appetite stimulant tried yet- we can try one when she awakens per POA - 6/4- start Megace  Hyperkalemia  - given Ca gluconate, D50 and Insulin but became hypoglycemic - due to AKI- K down  from 6.4 from 5.7- cont to hydrate- may go up after blood  - rechecked after transfusion and was 5.7- today is 5.4- follow  U retention - > 300 cc obtained with I and O cath overnight and again in AM - 6/3- foley placed  Anemia- FOB + - Hb 7.7 dropping from 10.4 on 5/21 - check anemia panel - transfuse 1 U PRBC - no blood loss noted at hom - POA does not want an endocopy    Hypertension/ Atrial fibrillation  - apparently has a remote history of A-fib - IV Lopressor in stead of Toprol XL 25 mg while lethargic - Cardizem 360 mg on hold    Type II diabetes mellitus  - on Lantus 8 u at home which is on hold - her grand daugther was giving it to her based on a sliding scale and holding it at times - SSI in hospital- sugars up as she is eating now and on D5 - resumed Lantus  Multinodular goiter  - noted on imaging - check TFTs- seems to be sick euthyroid    DVT prophylaxis: Lovenox Code Status: DNR Family Communication: [name], POA Disposition Plan: follow on med surg- if she continues to have a poor appetite despite starting appetite stimulant, would recommend hospice- palliative care consulted by admitter Consultants:   none Procedures:  Antimicrobials:  Anti-infectives (From admission, onward)   None       Objective: Vitals:   05/12/18 1510 05/12/18 1837 05/12/18 2017 05/13/18 0424  BP: (!) 147/99 (!) 165/96 (!) 150/76 (!) 168/80  Pulse: 75 80 83 72  Resp:  14 18 18   Temp:  98.4 F (36.9 C) 99 F (37.2 C) 98.7 F (37.1 C)  TempSrc:  Oral Oral Oral  SpO2:  99% 98% 97%  Weight:    50.5 kg (111 lb 5.3 oz)  Height:        Intake/Output Summary (Last 24 hours) at 05/13/2018 1236 Last data filed at 05/13/2018 1105 Gross per 24 hour  Intake 1469.92 ml  Output 1350 ml  Net 119.92 ml   Filed Weights   05/11/18 2100 05/13/18 0424  Weight: 49.7 kg (109 lb 9.1 oz) 50.5 kg (111 lb 5.3 oz)    Examination: General exam: Appears comfortable   HEENT: PERRLA,  oral mucosa moist, no sclera icterus or thrush Respiratory system: Clear to auscultation. Respiratory effort normal. Cardiovascular system: S1 & S2 heard, RRR.   Gastrointestinal system: Abdomen soft, non-tender, nondistended. Normal bowel sound. No organomegaly Extremities: No cyanosis, clubbing or edema Skin: No rashes or ulcers Psych: alert- flat affect     Data Reviewed: I have personally reviewed following labs and imaging studies  CBC: Recent Labs  Lab 05/11/18 1653 05/11/18 2159 05/12/18 0215 05/12/18 0709 05/12/18 1917 05/13/18 0441  WBC 7.4 6.1 5.0 4.3  --  4.8  NEUTROABS 6.1  --   --   --   --   --   HGB 7.7* 7.6* 7.0* 7.0* 10.1* 9.2*  HCT 23.5* 23.0* 21.6* 20.9* 30.8* 28.6*  MCV 92.9 93.5 93.5 93.3  --  92.6  PLT 220 208 202 191  --  213   Basic Metabolic Panel: Recent Labs  Lab 05/11/18 1653 05/12/18 0709 05/12/18 1634 05/12/18 2146 05/13/18 0441  NA 139 136 141 144 149*  K 6.4* 5.7* 6.0* 5.7* 5.4*  CL 109 110 114* 116* 121*  CO2 21* 19* 21* 19* 20*  GLUCOSE 112* 118* 80 106* 84  BUN 42* 36* 34* 33* 31*  CREATININE 3.82* 3.22* 2.89* 3.05* 2.57*  CALCIUM 9.4 8.8* 9.2 9.3 8.9   GFR: Estimated Creatinine Clearance: 12.5 mL/min (A) (by C-G formula based on SCr of 2.57 mg/dL (H)). Liver Function Tests: Recent Labs  Lab 05/11/18 1653 05/12/18 0709  AST 39 33  ALT 25 21  ALKPHOS 92 80  BILITOT 0.7 0.7  PROT 7.2 6.0*  ALBUMIN 4.1 3.2*   No results for input(s): LIPASE, AMYLASE in the last 168 hours. Recent Labs  Lab 05/11/18 2159  AMMONIA 14   Coagulation Profile: No results for input(s): INR, PROTIME in the last 168 hours. Cardiac Enzymes: Recent Labs  Lab 05/11/18 1650  TROPONINI <0.03   BNP (last 3 results) No results for input(s): PROBNP in the last 8760 hours. HbA1C: No results for input(s): HGBA1C in the last 72 hours. CBG: Recent Labs  Lab 05/12/18 2015 05/13/18 0028 05/13/18 0422 05/13/18 0742 05/13/18 1115  GLUCAP 87 84  77 72 236*   Lipid Profile: No results for input(s): CHOL, HDL, LDLCALC, TRIG, CHOLHDL, LDLDIRECT in the last 72 hours. Thyroid Function Tests: Recent Labs    05/13/18 0441  TSH 0.638  FREET4 0.72*   Anemia Panel: Recent Labs    05/12/18 1634  VITAMINB12 429  FOLATE 22.1  FERRITIN 174  TIBC 229*  IRON 39  RETICCTPCT 2.1   Urine analysis:    Component Value Date/Time   COLORURINE YELLOW 05/11/2018 1822   APPEARANCEUR CLEAR 05/11/2018 1822   LABSPEC 1.013 05/11/2018 1822   PHURINE 5.0 05/11/2018 1822   GLUCOSEU NEGATIVE 05/11/2018 1822   HGBUR NEGATIVE 05/11/2018 1822   BILIRUBINUR NEGATIVE 05/11/2018 1822   KETONESUR NEGATIVE 05/11/2018 1822   PROTEINUR 30 (A) 05/11/2018 1822   NITRITE NEGATIVE 05/11/2018 1822   LEUKOCYTESUR NEGATIVE 05/11/2018 1822   Sepsis Labs: @LABRCNTIP (procalcitonin:4,lacticidven:4) ) Recent Results (from the past 240 hour(s))  Urine culture     Status: None   Collection Time: 05/11/18  6:22 PM  Result Value Ref Range Status   Specimen Description   Final    URINE, CATHETERIZED Performed at [facility], [address] [place], [place] [address]    Special Requests   Final    NONE Performed at [facility], [address] [place], [place] [address]    Culture   Final    NO GROWTH Performed at [facility], [address] [place], [place] [address]    Report Status 05/13/2018 FINAL  Final         Radiology Studies: Ct Head Wo Contrast  Result Date: 05/11/2018 CLINICAL DATA:  Worsening confusion. Recent fall with head injury. Recent urinary tract infection. History of dementia. EXAM: CT HEAD WITHOUT CONTRAST CT CERVICAL SPINE WITHOUT CONTRAST TECHNIQUE: Multidetector CT imaging of the head and cervical spine was performed following the standard protocol without intravenous contrast. Multiplanar CT image reconstructions of the cervical spine were also generated. COMPARISON:  Brain MRI  04/30/2016 FINDINGS: CT HEAD FINDINGS Brain: There is no evidence of acute large territory infarct, intracranial hemorrhage, mass, midline shift, or extra-axial fluid collection. Mild cerebral atrophy is within normal limits for age. Periventricular white matter hypodensities are nonspecific but compatible with minimal for age chronic small vessel ischemic disease. Vascular: Calcified atherosclerosis at the skull base. No gross hyperdense vessel. Skull: No fracture focal osseous lesion. Sinuses/Orbits: Visualized paranasal sinuses and mastoid air cells are clear. Bilateral cataract extraction is noted. Other: None. CT CERVICAL SPINE FINDINGS Alignment: Cervical spine straightening. Trace retrolisthesis of C5 on C6, likely degenerative. Skull base and vertebrae: No acute fracture or suspicious osseous lesion. Soft tissues and spinal canal: No prevertebral fluid or swelling. No visible canal hematoma. Disc levels: Focally advanced disc degeneration at C5-6 including severe disc space narrowing, endplate sclerosis, and spurring resulting in mild left greater than right neural foraminal stenosis. Mild multilevel left-sided cervical facet arthrosis. No significant osseous spinal canal stenosis. Upper chest: Clear lung apices. Other: Left greater than right thyroid lobe enlargement and heterogeneity diffusely with evidence of multiple small underlying nodules compatible with multinodular goiter. Moderate right and extensive left carotid bifurcation calcified atherosclerosis. IMPRESSION: No evidence of acute intracranial abnormality or cervical spine fracture. Electronically Signed   By: Sebastian Ache M.D.   On: 05/11/2018 18:13   Ct Cervical Spine Wo Contrast  Result Date: 05/11/2018 CLINICAL DATA:  Worsening confusion. Recent fall with head injury. Recent urinary tract infection. History of dementia. EXAM: CT HEAD WITHOUT CONTRAST CT CERVICAL SPINE WITHOUT CONTRAST TECHNIQUE: Multidetector CT imaging of the head and  cervical spine was performed following the standard protocol without intravenous contrast. Multiplanar CT image reconstructions of the cervical spine were also generated. COMPARISON:  Brain MRI 04/30/2016 FINDINGS: CT HEAD FINDINGS Brain: There is no evidence of acute large territory infarct, intracranial hemorrhage, mass, midline shift, or extra-axial fluid  collection. Mild cerebral atrophy is within normal limits for age. Periventricular white matter hypodensities are nonspecific but compatible with minimal for age chronic small vessel ischemic disease. Vascular: Calcified atherosclerosis at the skull base. No gross hyperdense vessel. Skull: No fracture focal osseous lesion. Sinuses/Orbits: Visualized paranasal sinuses and mastoid air cells are clear. Bilateral cataract extraction is noted. Other: None. CT CERVICAL SPINE FINDINGS Alignment: Cervical spine straightening. Trace retrolisthesis of C5 on C6, likely degenerative. Skull base and vertebrae: No acute fracture or suspicious osseous lesion. Soft tissues and spinal canal: No prevertebral fluid or swelling. No visible canal hematoma. Disc levels: Focally advanced disc degeneration at C5-6 including severe disc space narrowing, endplate sclerosis, and spurring resulting in mild left greater than right neural foraminal stenosis. Mild multilevel left-sided cervical facet arthrosis. No significant osseous spinal canal stenosis. Upper chest: Clear lung apices. Other: Left greater than right thyroid lobe enlargement and heterogeneity diffusely with evidence of multiple small underlying nodules compatible with multinodular goiter. Moderate right and extensive left carotid bifurcation calcified atherosclerosis. IMPRESSION: No evidence of acute intracranial abnormality or cervical spine fracture. Electronically Signed   By: Sebastian Ache M.D.   On: 05/11/2018 18:13   Dg Pelvis Portable  Result Date: 05/12/2018 CLINICAL DATA:  Pain following fall EXAM: PORTABLE PELVIS  1-2 VIEWS COMPARISON:  None. FINDINGS: There is no evidence of pelvic fracture or dislocation. There is mild symmetric narrowing of both hip joints. No erosive change. IMPRESSION: Mild symmetric narrowing of both hip joints. No acute fracture or dislocation. Electronically Signed   By: Bretta Bang III M.D.   On: 05/12/2018 07:56   Dg Chest Port 1 View  Result Date: 05/12/2018 CLINICAL DATA:  Fall EXAM: PORTABLE CHEST 1 VIEW COMPARISON:  04/29/2018 FINDINGS: Heart size is normal. There is atherosclerosis and tortuosity of the aorta. There is mild atelectasis in the left lower lobe. Densities previously shot on the right appear to correlate with anterior rib fractures. No evidence of heart failure or effusion. Unhealed fracture noted of the left posterior 6 and seventh ribs, though not acute today. IMPRESSION: Mild left lower lobe atelectasis. Electronically Signed   By: Paulina Fusi M.D.   On: 05/12/2018 08:01      Scheduled Meds: . diltiazem  360 mg Oral Daily  . insulin aspart  0-9 Units Subcutaneous TID WC  . insulin glargine  8 Units Subcutaneous Daily  . mouth rinse  15 mL Mouth Rinse BID  . megestrol  400 mg Oral BID  . metoprolol succinate  25 mg Oral Daily  . multivitamin  1 tablet Oral QHS  . pantoprazole  40 mg Oral QHS  . sertraline  100 mg Oral QHS   Continuous Infusions: . dextrose 110 mL/hr at 05/13/18 0900     LOS: 1 day    Time spent in minutes: 35    Calvert Cantor, MD [facility] Pager: [email or web address] Password [facility] 05/13/2018, 12:36 PM

## 2018-05-14 DIAGNOSIS — I10 Essential (primary) hypertension: Secondary | ICD-10-CM | POA: Diagnosis not present

## 2018-05-14 DIAGNOSIS — G9340 Encephalopathy, unspecified: Secondary | ICD-10-CM | POA: Diagnosis not present

## 2018-05-14 DIAGNOSIS — Z794 Long term (current) use of insulin: Secondary | ICD-10-CM

## 2018-05-14 DIAGNOSIS — D62 Acute posthemorrhagic anemia: Secondary | ICD-10-CM | POA: Diagnosis not present

## 2018-05-14 DIAGNOSIS — I480 Paroxysmal atrial fibrillation: Secondary | ICD-10-CM

## 2018-05-14 DIAGNOSIS — E118 Type 2 diabetes mellitus with unspecified complications: Secondary | ICD-10-CM

## 2018-05-14 DIAGNOSIS — E042 Nontoxic multinodular goiter: Secondary | ICD-10-CM | POA: Diagnosis not present

## 2018-05-14 DIAGNOSIS — W19XXXD Unspecified fall, subsequent encounter: Secondary | ICD-10-CM | POA: Diagnosis not present

## 2018-05-14 DIAGNOSIS — N179 Acute kidney failure, unspecified: Secondary | ICD-10-CM

## 2018-05-14 DIAGNOSIS — E43 Unspecified severe protein-calorie malnutrition: Secondary | ICD-10-CM | POA: Diagnosis not present

## 2018-05-14 DIAGNOSIS — G309 Alzheimer's disease, unspecified: Secondary | ICD-10-CM | POA: Diagnosis not present

## 2018-05-14 DIAGNOSIS — G9349 Other encephalopathy: Secondary | ICD-10-CM | POA: Diagnosis not present

## 2018-05-14 DIAGNOSIS — N183 Chronic kidney disease, stage 3 (moderate): Secondary | ICD-10-CM

## 2018-05-14 LAB — BASIC METABOLIC PANEL
Anion gap: 8 (ref 5–15)
BUN: 29 mg/dL — AB (ref 6–20)
CHLORIDE: 111 mmol/L (ref 101–111)
CO2: 18 mmol/L — AB (ref 22–32)
Calcium: 8.3 mg/dL — ABNORMAL LOW (ref 8.9–10.3)
Creatinine, Ser: 2.14 mg/dL — ABNORMAL HIGH (ref 0.44–1.00)
GFR calc non Af Amer: 20 mL/min — ABNORMAL LOW (ref >60)
GFR, EST AFRICAN AMERICAN: 23 mL/min — AB (ref >60)
Glucose, Bld: 97 mg/dL (ref 65–99)
Potassium: 4.5 mmol/L (ref 3.5–5.1)
SODIUM: 137 mmol/L (ref 135–145)

## 2018-05-14 LAB — CBC WITH DIFFERENTIAL/PLATELET
Basophils Absolute: 0 10*3/uL (ref 0.0–0.1)
Basophils Relative: 0 %
EOS PCT: 2 %
Eosinophils Absolute: 0.1 10*3/uL (ref 0.0–0.7)
HEMATOCRIT: 25.9 % — AB (ref 36.0–46.0)
Hemoglobin: 8.3 g/dL — ABNORMAL LOW (ref 12.0–15.0)
LYMPHS ABS: 1.8 10*3/uL (ref 0.7–4.0)
LYMPHS PCT: 30 %
MCH: 29.7 pg (ref 26.0–34.0)
MCHC: 32 g/dL (ref 30.0–36.0)
MCV: 92.8 fL (ref 78.0–100.0)
MONO ABS: 0.2 10*3/uL (ref 0.1–1.0)
Monocytes Relative: 3 %
Neutro Abs: 4 10*3/uL (ref 1.7–7.7)
Neutrophils Relative %: 65 %
Platelets: 197 10*3/uL (ref 150–400)
RBC: 2.79 MIL/uL — ABNORMAL LOW (ref 3.87–5.11)
RDW: 15.3 % (ref 11.5–15.5)
WBC: 6.1 10*3/uL (ref 4.0–10.5)

## 2018-05-14 LAB — GLUCOSE, CAPILLARY
GLUCOSE-CAPILLARY: 160 mg/dL — AB (ref 65–99)
Glucose-Capillary: 131 mg/dL — ABNORMAL HIGH (ref 65–99)

## 2018-05-14 LAB — MAGNESIUM: MAGNESIUM: 1.6 mg/dL — AB (ref 1.7–2.4)

## 2018-05-14 LAB — PHOSPHORUS: PHOSPHORUS: 3.1 mg/dL (ref 2.5–4.6)

## 2018-05-14 MED ORDER — ONDANSETRON HCL 4 MG PO TABS: 4.0000 mg | ORAL_TABLET | Freq: Four times a day (QID) | ORAL | 0 refills | Status: AC | PRN

## 2018-05-14 MED ORDER — MEGESTROL ACETATE 400 MG/10ML PO SUSP: 400.0000 mg | Freq: Two times a day (BID) | ORAL | 0 refills | Status: AC

## 2018-05-14 MED ORDER — ENSURE ENLIVE PO LIQD: 237.0000 mL | Freq: Two times a day (BID) | ORAL | 12 refills | Status: AC

## 2018-05-14 MED ORDER — TRAMADOL-ACETAMINOPHEN 37.5-325 MG PO TABS: 1.0000 | ORAL_TABLET | Freq: Four times a day (QID) | ORAL | 0 refills | Status: AC | PRN

## 2018-05-14 NOTE — Progress Notes (Addendum)
[facility] Lia[facility]:  RN visit  Notified by Nelida Goresookie, [facility], of patient/family request for [facility] after discharge.  Chart and patient information have been reviewed with [facility] Phys[facility].  Hospice eligibility approved by Dr. Anne FuFeldmann.  Writer spoke with patient and Althea, daughter, at bedside to initiate education related to hospice philosophy, services and team approach to care.  Patient/family verbalized understanding of information given.  Per discussion, plan is for discharge to home by PR[facility] VEHICLE on 05/14/18.  Please send signed and completed DNR form home with patient/family.  Patient will need prescriptions for discharge comfort medications.  DME needs have been discussed, patient currently has the following equipment in the home: walker, shower chair and lifted toilet seat.  Patient/family requests the following DME for delivery to the home:  Standard wheelchair and 3 in 1. [facility] equipment manager, Precious Reelndrea Waugh, has been notified and will contact [facility] to arrange delivery to the home.  The home address has been verified and is correct in the chart.  Willette Almalthea, daughter, at [phone] is the family member to contact to arrange time of delivery.  [facility] is aware of the above.  Completed discharge summary will need to be faxed to [facility] at [phone], when final.  Please notify [facility] when patient is ready to leave the unit at discharge.  (Call [phone] or [phone] if its after 5pm.)  [facility] information and contact numbers have been given to Althea during visit.  Above information shared with Cookie, CMRN.  Please call with any hospice related questions.  Thank you for the referral.   Adele BarthelAmy Evans, RN, BSN [facility] Hospital Liaison [phone]  All hospital liaisons are now on AMION.

## 2018-05-14 NOTE — Progress Notes (Signed)
Spoke with pt's daughter Willette Almalthea concerning HH. [facility] and will continue at discharge with these services. Hospice checking to see if the two agencies can go in together.

## 2018-05-14 NOTE — Progress Notes (Addendum)
Daily Progress Note   Patient Name: Shari Golden       Date: 05/14/2018 DOB: Jun 12, 1930  Age: 84 y.o. MRN#: [id number] Attending Physician: Merlene LaughterSheikh, Omair Latif, DO Primary Care Physician: Richardean Chimeraaniel, Terry G, MD Admit Date: 05/11/2018  Reason for Consultation/Follow-up: Establishing goals of care  Subjective: Patient is resting in bed. She is oriented to self only. She is confused and unable to answer any other questions. Daughter present at bedside. She states she has spoken with her daughter [name] who is in agreement on transitioning [name] to hospice. She inquires about someone to fill out POA papers, and states [name] takes care of [name]'s bills. She states she is unaware of a living will or POA papers that already exist.    Spoke with [name] via phone to answer any questions; [name] is a travel Engineer, civil (consulting)nurse.Concepts of  Hospice explained to her and she is in agreement. She states there is a  Living Will that was brought into hospital noting her as POA. She states it is okay for her mother to fill out forms for hospice care. This Living Will form was not noted in EMR, however WAS noted in the hard chart. The document outlines no life prolonging measures in the case of dementia, the fact that the document will supersede her health agent's decisions if indicated, and acceptance of hospice if deemed appropriate by health care agent.    MOST form completed for DNR, comfort measures, discussion of abx at time of indication, no IV fluids, no feeding tube.    Length of Stay: 2  Current Medications: Scheduled Meds:  . diltiazem  360 mg Oral Daily  . feeding supplement (ENSURE ENLIVE)  237 mL Oral BID BM  . insulin aspart  0-9 Units Subcutaneous TID WC  . insulin glargine  8 Units  Subcutaneous Daily  . mouth rinse  15 mL Mouth Rinse BID  . megestrol  400 mg Oral BID  . metoprolol succinate  25 mg Oral Daily  . multivitamin  1 tablet Oral QHS  . pantoprazole  40 mg Oral QHS  . sertraline  100 mg Oral QHS    Continuous Infusions:   PRN Meds: acetaminophen **OR** acetaminophen, ondansetron **OR** ondansetron (ZOFRAN) IV  Physical Exam  Constitutional: No distress.  Pulmonary/Chest: Effort normal.  Neurological: She is  alert.  Confused            Vital Signs: BP (!) 153/62 (BP Location: Right Arm)   Pulse 69   Temp 99.3 F (37.4 C) (Oral)   Resp 18   Ht 5\' 7"  (1.702 m)   Wt 53.1 kg (117 lb 1.6 oz)   SpO2 97%   BMI 18.34 kg/m  SpO2: SpO2: 97 % O2 Device: O2 Device: Room Air O2 Flow Rate:    Intake/output summary:   Intake/Output Summary (Last 24 hours) at 05/14/2018 0944 Last data filed at 05/14/2018 0251 Gross per 24 hour  Intake 1260 ml  Output 575 ml  Net 685 ml   LBM: Last BM Date: (Unable to assess.) Baseline Weight: Weight: 49.7 kg (109 lb 9.1 oz) Most recent weight: Weight: 53.1 kg (117 lb 1.6 oz)       Palliative Assessment/Data: 40%      Patient Active Problem List   Diagnosis Date Noted  . Protein-calorie malnutrition, severe 05/14/2018  . AKI (acute kidney injury) (HCC) 05/12/2018  . Multinodular goiter 05/12/2018  . Fall   . Acute encephalopathy 05/11/2018  . Acute blood loss anemia 05/11/2018  . Controlled diabetes mellitus type 2 with complications (HCC)   . Accidental overdose   . Bradycardia 01/31/2017  . Essential hypertension 01/31/2017  . Anemia 01/31/2017  . Hyperlipidemia 01/31/2017  . Hyperkalemia 01/31/2017  . Burning sensation-right arm/wrist 11/17/2013  . Weakness of wrist-Right, and arm 11/17/2013  . Occlusion and stenosis of carotid artery without mention of cerebral infarction 11/04/2012  . Type II diabetes mellitus (HCC) 10/24/2011  . Hypertension   . CKD (chronic kidney disease), stage III (HCC)     . GERD (gastroesophageal reflux disease)   . Peripheral neuropathy   . Chest tightness   . Atrial fibrillation (HCC)   . Ejection fraction   . Mitral regurgitation   . Atrial septal aneurysm   . Edema   . Anxiety   . Carotid artery disease [facility])     Palliative Care Assessment & Plan   Patient Profile: Shari Acri Dillardis a 84 y/o with A-fib, HTN, DM2, alzheimer's dementia, CKD3, CAD who is brought by her daughter for confusion, not answering questions and falling which started on Friday.   Assessment/Recommendations/Plan:  Plan for home with hospice.   Poor prognosis, < 6 months. CKD. AKI: Creatinine of 3.82 on admission improved by IV fluids. Poor PO intake with weight loss of 10 pounds in 1 month.  Dementia, alert to self. Falls.  Stool positive for occult blood with  blood transfusion this hospitalization.     Goals of Care and Additional Recommendations:  Limitations on Scope of Treatment: Focus on comfort.   Code Status:    Code Status Orders  (From admission, onward)        Start     Ordered   05/11/18 2008  Do not attempt resuscitation (DNR)  Continuous    Question Answer Comment  In the event of cardiac or respiratory ARREST Do not call a "code blue"   In the event of cardiac or respiratory ARREST Do not perform Intubation, CPR, defibrillation or ACLS   In the event of cardiac or respiratory ARREST Use medication by any route, position, wound care, and other measures to relive pain and suffering. May use oxygen, suction and manual treatment of airway obstruction as needed for comfort.      05/11/18 2010    Code Status History    Date Active Date  Inactive Code Status Order ID Comments User Context   05/11/2018 1954 05/11/2018 2010 DNR [id number]  Eduard Clos, MD ED   02/01/2017 [phone] 02/02/2017 2105 DNR [id number]  Bobette Mo, MD Inpatient    Advance Directive Documentation     Most Recent Value  Type of Advance Directive  Out of facility DNR  (pink MOST or yellow form), Living will, Healthcare Power of Attorney  Pre-existing out of facility DNR order (yellow form or pink MOST form)  Yellow form placed in chart (order not valid for inpatient use)  "MOST" Form in Place?  -       Prognosis:   < 6 months  Discharge Planning:  Recommend home with hospice.  Care plan was discussed with CM and MD  Thank you for allowing the Palliative Medicine Team to assist in the care of this patient.   Time In: 09:00 Time Out: 10:20 Total Time 80 min Prolonged Time Billed  yes      Greater than 50%  of this time was spent counseling and coordinating care related to the above assessment and plan.  Morton Stall, NP  Please contact Palliative Medicine Team phone at [phone] for questions and concerns.

## 2018-05-14 NOTE — Evaluation (Signed)
Physical Therapy Evaluation Patient Details Name: Shari Golden MRN: [id number] DOB: [birth year] Today's Date: 05/14/2018   History of Present Illness  Shari Golden is a 84 y/o with A-fib, HTN, DM2, alzheimer's dementia, CKD3, CAD who is brought by her daughter for confusion, not answering questions and falling which started on Friday.  Clinical Impression  Pt admitted with above diagnosis. Pt currently with functional limitations due to the deficits listed below (see PT Problem List).  Discussed PT role with dtr,they have met with PC and are leaning toward comfort care, however dtr would like pt to mobilize if she is able; Pt agreeable to amb this am; will follow in acute setting  Pt will benefit from skilled PT to increase their independence an d safety with mobility to allow discharge to the venue listed below.       Follow Up Recommendations No PT follow up;Supervision/Assistance - 24 hour    Equipment Recommendations  3in1 (PT);Other (comment)(transport chair)    Recommendations for Other Services       Precautions / Restrictions Precautions Precautions: Fall      Mobility  Bed Mobility Overal bed mobility: Needs Assistance Bed Mobility: Supine to Sit     Supine to sit: HOB elevated;Min guard     General bed mobility comments: HOB at 30*, min/guard to elevate trunk  Transfers Overall transfer level: Needs assistance Equipment used: Rolling walker (2 wheeled) Transfers: Sit to/from Stand Sit to Stand: Min guard;Min assist         General transfer comment: steadying assist to rise  Ambulation/Gait Ambulation/Gait assistance: Min guard;Min assist Ambulation Distance (Feet): 75 Feet Assistive device: IV Pole Gait Pattern/deviations: Step-through pattern;Decreased stride length;Drifts right/left     General Gait Details: mildly unsteady gait, no overt LOB  Stairs            Wheelchair Mobility    Modified Rankin (Stroke Patients Only)        Balance Overall balance assessment: Needs assistance Sitting-balance support: Feet supported;No upper extremity supported Sitting balance-Leahy Scale: Good       Standing balance-Leahy Scale: Fair                               Pertinent Vitals/Pain Pain Assessment: Faces Faces Pain Scale: No hurt    Home Living Family/patient expects to be discharged to:: Private residence Living Arrangements: (dtr) Available Help at Discharge: Family;Personal care attendant Type of Home: Apartment Home Access: Level entry     Home Layout: One level Home Equipment: Environmental consultant - 2 wheels;Crutches      Prior Function Level of Independence: Needs assistance   Gait / Transfers Assistance Needed: HHA to amb; aide walks with pt daily   ADL's / Homemaking Assistance Needed: aide assists prn, pt was feeding herself prior to admission        Hand Dominance   Dominant Hand: Right    Extremity/Trunk Assessment   Upper Extremity Assessment Upper Extremity Assessment: Generalized weakness    Lower Extremity Assessment Lower Extremity Assessment: Generalized weakness       Communication   Communication: No difficulties  Cognition Arousal/Alertness: Awake/alert Behavior During Therapy: WFL for tasks assessed/performed Overall Cognitive Status: History of cognitive impairments - at baseline  General Comments      Exercises     Assessment/Plan    PT Assessment Patient needs continued PT services  PT Problem List Decreased strength;Decreased activity tolerance;Decreased balance;Decreased mobility;Decreased cognition       PT Treatment Interventions Therapeutic activities;Gait training;Functional mobility training;Balance training;Therapeutic exercise;Patient/family education    PT Goals (Current goals can be found in the Care Plan section)  Acute Rehab PT Goals Patient Stated Goal: pt wants food PT Goal  Formulation: With family Time For Goal Achievement: 05/28/18 Potential to Achieve Goals: Good    Frequency Min 3X/week   Barriers to discharge        Co-evaluation               AM-PAC PT "6 Clicks" Daily Activity  Outcome Measure Difficulty turning over in bed (including adjusting bedclothes, sheets and blankets)?: A Little Difficulty moving from lying on back to sitting on the side of the bed? : A Little Difficulty sitting down on and standing up from a chair with arms (e.g., wheelchair, bedside commode, etc,.)?: Unable Help needed moving to and from a bed to chair (including a wheelchair)?: A Little Help needed walking in hospital room?: A Little Help needed climbing 3-5 steps with a railing? : A Little 6 Click Score: 16    End of Session Equipment Utilized During Treatment: Gait belt Activity Tolerance: Patient tolerated treatment well Patient left: in chair;with call bell/phone within reach;with family/visitor present;with chair alarm set Nurse Communication: Mobility status PT Visit Diagnosis: Unsteadiness on feet (R26.81)    Time: 1065-3990 PT Time Calculation (min) (ACUTE ONLY): 31 min   Charges:   PT Evaluation $PT Eval Low Complexity: 1 Low PT Treatments $Gait Training: 8-22 mins   PT G CodesKenyon Ana, PT Pager: [phone] 05/14/2018   [facility] 05/14/2018, 10:21 AM

## 2018-05-14 NOTE — Progress Notes (Signed)
Pt[facility].
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
## 2018-05-26 DIAGNOSIS — E1122 Type 2 diabetes mellitus with diabetic chronic kidney disease: Secondary | ICD-10-CM | POA: Diagnosis not present

## 2018-05-26 DIAGNOSIS — M5136 Other intervertebral disc degeneration, lumbar region: Secondary | ICD-10-CM | POA: Diagnosis not present

## 2018-06-19 DIAGNOSIS — E440 Moderate protein-calorie malnutrition: Secondary | ICD-10-CM | POA: Diagnosis not present

## 2018-06-19 DIAGNOSIS — E1122 Type 2 diabetes mellitus with diabetic chronic kidney disease: Secondary | ICD-10-CM | POA: Diagnosis not present

## 2018-06-24 ENCOUNTER — Encounter (HOSPITAL_COMMUNITY): Payer: Self-pay | Admitting: *Deleted

## 2018-06-24 ENCOUNTER — Emergency Department (HOSPITAL_COMMUNITY)
Admission: EM | Admit: 2018-06-24 | Discharge: 2018-06-24 | Disposition: A | Discharge status: Home / Self Care | Attending: Emergency Medicine | Admitting: Emergency Medicine

## 2018-06-24 ENCOUNTER — Emergency Department (HOSPITAL_COMMUNITY)

## 2018-06-24 DIAGNOSIS — E1122 Type 2 diabetes mellitus with diabetic chronic kidney disease: Secondary | ICD-10-CM | POA: Insufficient documentation

## 2018-06-24 DIAGNOSIS — I129 Hypertensive chronic kidney disease with stage 1 through stage 4 chronic kidney disease, or unspecified chronic kidney disease: Secondary | ICD-10-CM | POA: Diagnosis not present

## 2018-06-24 DIAGNOSIS — Y939 Activity, unspecified: Secondary | ICD-10-CM | POA: Diagnosis not present

## 2018-06-24 DIAGNOSIS — W19XXXA Unspecified fall, initial encounter: Secondary | ICD-10-CM | POA: Insufficient documentation

## 2018-06-24 DIAGNOSIS — Y92008 Other place in unspecified non-institutional (private) residence as the place of occurrence of the external cause: Secondary | ICD-10-CM | POA: Diagnosis not present

## 2018-06-24 DIAGNOSIS — Z794 Long term (current) use of insulin: Secondary | ICD-10-CM | POA: Diagnosis not present

## 2018-06-24 DIAGNOSIS — S0081XA Abrasion of other part of head, initial encounter: Secondary | ICD-10-CM | POA: Diagnosis not present

## 2018-06-24 DIAGNOSIS — Y999 Unspecified external cause status: Secondary | ICD-10-CM | POA: Diagnosis not present

## 2018-06-24 DIAGNOSIS — S0990XA Unspecified injury of head, initial encounter: Secondary | ICD-10-CM | POA: Insufficient documentation

## 2018-06-24 DIAGNOSIS — R404 Transient alteration of awareness: Secondary | ICD-10-CM | POA: Diagnosis not present

## 2018-06-24 DIAGNOSIS — Z79899 Other long term (current) drug therapy: Secondary | ICD-10-CM | POA: Diagnosis not present

## 2018-06-24 DIAGNOSIS — I10 Essential (primary) hypertension: Secondary | ICD-10-CM | POA: Diagnosis not present

## 2018-06-24 DIAGNOSIS — F0390 Unspecified dementia without behavioral disturbance: Secondary | ICD-10-CM | POA: Insufficient documentation

## 2018-06-24 DIAGNOSIS — N183 Chronic kidney disease, stage 3 (moderate): Secondary | ICD-10-CM | POA: Diagnosis not present

## 2018-06-24 DIAGNOSIS — R140 Abdominal distension (gaseous): Secondary | ICD-10-CM | POA: Diagnosis not present
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
## 2018-06-24 NOTE — ED Provider Notes (Signed)
[facility]-EMERGENCY DEPT Provider Note   CSN: [id number] Arrival date & time: 06/24/18  1004     History   Chief Complaint Chief Complaint  Patient presents with  . Fall    HPI Shari Golden is a 84 y.o. female.  84 year old female with history of dementia presents after being found outside for about 10 minutes after having an unwitnessed fall.  Patient noted to have an abrasion to her forehead.  Her mental status was noted to be at her baseline.  EMS called patient transported here.  The history is limited due to her current state     Past Medical History:  Diagnosis Date  . Anemia   . Anxiety   . Arthritis   . Carotid artery disease (HCC)    a. 12/2015 Carotid u/s: 1-39% bilat ICA stenosis-->f/u prn.  . CKD (chronic kidney disease), stage III (HCC)    Dr. Fausto SkillernBefakadu  . Constipation   . Essential hypertension   . GERD (gastroesophageal reflux disease)   . History of cardiac catheterization    a. 2004 Cath: Normal coronaries.  Marland Kitchen. PAF (paroxysmal atrial fibrillation) (HCC)    a. Remotely documented without recurrence.  . Peripheral neuropathy   . Type II diabetes mellitus (HCC)    a. 01/2017 HbA1c 7.0.    Patient Active Problem List   Diagnosis Date Noted  . Protein-calorie malnutrition, severe 05/14/2018  . AKI (acute kidney injury) (HCC) 05/12/2018  . Multinodular goiter 05/12/2018  . Fall   . Acute encephalopathy 05/11/2018  . Acute blood loss anemia 05/11/2018  . Controlled diabetes mellitus type 2 with complications (HCC)   . Accidental overdose   . Bradycardia 01/31/2017  . Essential hypertension 01/31/2017  . Anemia 01/31/2017  . Hyperlipidemia 01/31/2017  . Hyperkalemia 01/31/2017  . Burning sensation-right arm/wrist 11/17/2013  . Weakness of wrist-Right, and arm 11/17/2013  . Occlusion and stenosis of carotid artery without mention of cerebral infarction 11/04/2012  . Type II diabetes mellitus (HCC) 10/24/2011  . Hypertension    . CKD (chronic kidney disease), stage III (HCC)   . GERD (gastroesophageal reflux disease)   . Peripheral neuropathy   . Chest tightness   . Atrial fibrillation (HCC)   . Ejection fraction   . Mitral regurgitation   . Atrial septal aneurysm   . Edema   . Anxiety   . Carotid artery disease [facility](HCC)     Past Surgical History:  Procedure Laterality Date  . ABDOMINAL HYSTERECTOMY    . APPENDECTOMY    . CARPAL TUNNEL RELEASE Bilateral   . LUMBAR LAMINECTOMY  ~ 2011  . SHOULDER ARTHROSCOPY W/ ROTATOR CUFF REPAIR Left   . TONSILLECTOMY  2012  . WRIST FUSION Right 04/01/2013   Procedure: RIGHT WRIST ARTHRODESIS WITH POSSIBLE LOCAL BONE GRAFT;  Surgeon: Sharma CovertFred W Ortmann, MD;  Location: [facility] OR;  Service: Orthopedics;  Laterality: Right;     OB History   None      Home Medications    Prior to Admission medications   Medication Sig Start Date End Date Taking? Authorizing Provider  acetaminophen (TYLENOL) 325 MG tablet Take 650 mg by mouth every 6 (six) hours as needed for moderate pain.     [provider]  diltiazem (CARDIZEM CD) 360 MG 24 hr capsule Take 360 mg by mouth daily.    [provider]  feeding supplement, ENSURE ENLIVE, (ENSURE ENLIVE) LIQD Take 237 mLs by mouth 2 (two) times daily between meals. 05/14/18  Sheikh, Omair Latif, DO  insulin glargine (LANTUS) 100 UNIT/ML injection Inject 8 Units into the skin daily. Sliding scale    [provider]  megestrol (MEGACE) 400 MG/10ML suspension Take 10 mLs (400 mg total) by mouth 2 (two) times daily. 05/14/18   Marguerita Merles Latif, DO  metoprolol succinate (TOPROL-XL) 25 MG 24 hr tablet TAKE 1 TABLET BY MOUTH ONCE DAILY 03/28/18   Chilton Si, MD  multivitamin (RENA-VIT) TABS tablet Take 1 tablet by mouth at bedtime.     [provider]  ondansetron (ZOFRAN) 4 MG tablet Take 1 tablet (4 mg total) by mouth every 6 (six) hours as needed for nausea. 05/14/18   Sheikh, Omair Latif, DO  pantoprazole  (PROTONIX) 40 MG tablet Take 40 mg by mouth at bedtime.     [provider]  sertraline (ZOLOFT) 100 MG tablet Take 100 mg by mouth at bedtime.  02/19/17   Richardean Chimera, MD  traMADol-acetaminophen (ULTRACET) 37.5-325 MG tablet Take 1 tablet by mouth every 6 (six) hours as needed for moderate pain. 05/14/18   Merlene Laughter, DO    Family History Family History  Problem Relation Age of Onset  . Diabetes Sister   . Heart disease Sister   . Prostate cancer Father   . Stroke Neg Hx   . Coronary artery disease Neg Hx     Social History Social History   Tobacco Use  . Smoking status: Never Smoker  . Smokeless tobacco: Former Neurosurgeon    Types: Snuff  . Tobacco comment: 04/01/2013 "smoked 3 cigarettes in 1956; stopped; quit dipping > 20 yr ago"  Substance Use Topics  . Alcohol use: No    Alcohol/week: 0.0 oz  . Drug use: No     Allergies   Erythromycin; Iodine; Iohexol; Latex; and Penicillins   Review of Systems Review of Systems  Unable to perform ROS: Dementia     Physical Exam Updated Vital Signs BP (!) 176/69 (BP Location: Left Arm)   Pulse 68   Temp 98.4 F (36.9 C) (Oral)   Resp 18   SpO2 99%   Physical Exam  Constitutional: She appears well-developed and well-nourished.  Non-toxic appearance. No distress.  HENT:  Head:    Eyes: Pupils are equal, round, and reactive to light. Conjunctivae, EOM and lids are normal.  Neck: Normal range of motion. Neck supple. No tracheal deviation present. No thyroid mass present.  Cardiovascular: Normal rate, regular rhythm and normal heart sounds. Exam reveals no gallop.  No murmur heard. Pulmonary/Chest: Effort normal and breath sounds normal. No stridor. No respiratory distress. She has no decreased breath sounds. She has no wheezes. She has no rhonchi. She has no rales.  Abdominal: Soft. Normal appearance and bowel sounds are normal. She exhibits no distension. There is no tenderness. There is no rebound and no  CVA tenderness.  Musculoskeletal: Normal range of motion. She exhibits no edema or tenderness.  Neurological: She is alert. She displays atrophy. No cranial nerve deficit or sensory deficit. GCS eye subscore is 4. GCS verbal subscore is 4. GCS motor subscore is 5.  Skin: Skin is warm and dry. No abrasion and no rash noted.  Psychiatric: Her affect is blunt.  Nursing note and vitals reviewed.    ED Treatments / Results  Labs (all labs ordered are listed, but only abnormal results are displayed) Labs Reviewed - No data to display  EKG None  Radiology No results found.  Procedures Procedures (including critical care time)  Medications Ordered in ED Medications - No data to display   Initial Impression / Assessment and Plan / ED Course  I have reviewed the triage vital signs and the nursing notes.  Pertinent labs & imaging results that were available during my care of the patient were reviewed by me and considered in my medical decision making (see chart for details).     CTA head and cervical spine without acute findings.  Patient is here with her relative who states that she is at baseline.  Return precautions given  Final Clinical Impressions(s) / ED Diagnoses   Final diagnoses:  None    ED Discharge Orders    None       Lorre Nick, MD 06/24/18 1303

## 2018-07-25 DIAGNOSIS — E1122 Type 2 diabetes mellitus with diabetic chronic kidney disease: Secondary | ICD-10-CM | POA: Diagnosis not present

## 2018-07-25 DIAGNOSIS — E440 Moderate protein-calorie malnutrition: Secondary | ICD-10-CM | POA: Diagnosis not present

## 2018-08-04 DIAGNOSIS — E1142 Type 2 diabetes mellitus with diabetic polyneuropathy: Secondary | ICD-10-CM | POA: Diagnosis not present

## 2018-08-04 DIAGNOSIS — E1122 Type 2 diabetes mellitus with diabetic chronic kidney disease: Secondary | ICD-10-CM | POA: Diagnosis not present

## 2018-08-04 DIAGNOSIS — E119 Type 2 diabetes mellitus without complications: Secondary | ICD-10-CM | POA: Diagnosis not present

## 2018-11-11 ENCOUNTER — Telehealth: Payer: Self-pay

## 2018-11-11 DIAGNOSIS — E119 Type 2 diabetes mellitus without complications: Secondary | ICD-10-CM | POA: Diagnosis not present

## 2018-11-11 DIAGNOSIS — E1142 Type 2 diabetes mellitus with diabetic polyneuropathy: Secondary | ICD-10-CM | POA: Diagnosis not present

## 2018-11-11 DIAGNOSIS — E1122 Type 2 diabetes mellitus with diabetic chronic kidney disease: Secondary | ICD-10-CM | POA: Diagnosis not present

## 2018-11-11 NOTE — Telephone Encounter (Signed)
Visit scheduled for Friday 11/14/18 @ 3:00pm

## 2018-11-14 ENCOUNTER — Other Ambulatory Visit: Payer: Medicare Other | Admitting: Primary Care

## 2018-11-14 DIAGNOSIS — Z515 Encounter for palliative care: Secondary | ICD-10-CM | POA: Diagnosis not present

## 2018-11-14 DIAGNOSIS — W19XXXS Unspecified fall, sequela: Secondary | ICD-10-CM | POA: Diagnosis not present

## 2018-11-14 NOTE — Progress Notes (Signed)
[facility] Telephone: [phone] Fax: [phone]  PATIENT NAME: Shari Golden Haston DOB: March 01, 1930 MRN: [id number]  PRIMARY CARE PROVIDER:   Richardean Chimeraaniel, Terry G, MD  REFERRING PROVIDER:  Richardean Chimeraaniel, Terry G, MD [address] [place], [place] [address]  RESPONSIBLE PARTY:   Extended Emergency Contact Information Primary Emergency Contact: Jen MowBingim, Jennifer [facility] Mobile Phone: [phone] Relation: Daughter Secondary Emergency Contact: Simmie DaviesBoyd,Samantha  [phone] [place] Mobile Phone: [phone] Relation: Granddaughter   ASSESSMENT and RECOMMENDATIONS:   1. Falls: Frequent falls. Gets up alone at hs, combative behaviors at night as well. Trazodone100 mg a hs  has helped hs wandering and combative behavior. Could consider mirtazapine for appetite and behaviors if needed.  2. Nutrition: 116 lb now, up from 106 lb. Eating well now with glucerna supplements. Will monitor weights, intake. No pressure injury at this time.   3. Goals of Care: DNR and  most form given for review, will complete on next visit.  Patient d/ced from hospice due to stability. Will monitor under palliative program for symptom management and goals of care discussions. Return 1-2 months.  I spent 45 minutes providing this consultation,  from 1545 to 1630. More than 50% of the time in this consultation was spent coordinating communication.   HISTORY OF PRESENT ILLNESS:  Shari Golden Cavanaugh is a 84 y.o. year old female with multiple medical problems including HTN, MR, A fib, DM2, Dementia. Palliative Care was asked to help address goals of care.   CODE STATUS: DNR, MOST form given for review PPS: 30% HOSPICE ELIGIBILITY/DIAGNOSIS: TBD  PAST MEDICAL HISTORY:  Past Medical History:  Diagnosis Date  . Anemia   . Anxiety   . Arthritis   . Carotid artery disease (HCC)    a. 12/2015 Carotid u/s: 1-39% bilat ICA stenosis-->f/u prn.  . CKD (chronic kidney disease), stage III (HCC)    Dr. Fausto SkillernBefakadu  .  Constipation   . Essential hypertension   . GERD (gastroesophageal reflux disease)   . History of cardiac catheterization    a. 2004 Cath: Normal coronaries.  Marland Kitchen. PAF (paroxysmal atrial fibrillation) (HCC)    a. Remotely documented without recurrence.  . Peripheral neuropathy   . Type II diabetes mellitus (HCC)    a. 01/2017 HbA1c 7.0.    SOCIAL HX:  Social History   Tobacco Use  . Smoking status: Never Smoker  . Smokeless tobacco: Former NeurosurgeonUser    Types: Snuff  . Tobacco comment: 04/01/2013 "smoked 3 cigarettes in 1956; stopped; quit dipping > 20 yr ago"  Substance Use Topics  . Alcohol use: No    Alcohol/week: 0.0 standard drinks    ALLERGIES:  Allergies  Allergen Reactions  . Penicillins Anaphylaxis, Itching and Rash    Has patient had a PCN reaction causing immediate rash, facial/tongue/throat swelling, SOB or lightheadedness with hypotension: Yes Has patient had a PCN reaction causing severe rash involving mucus membranes or skin necrosis: Unknown Has patient had a PCN reaction that required hospitalization: Unknown Has patient had a PCN reaction occurring within the last 10 years: Unknown If all of the above answers are "NO", then may proceed with Cephalosporin use.   . Erythromycin Other (See Comments)    REACTION: Dehydration  . Iodine Itching, Rash and Other (See Comments)    REACTION: dizzy  . Iohexol Other (See Comments)     Code: HIVES, Desc: IODINE BASED CONTRAST   . Latex Swelling and Other (See Comments)    REACTION: Swelling (If touched on  face will have facial swelling)     PERTINENT MEDICATIONS:  Outpatient Encounter Medications as of 11/14/2018  Medication Sig  . acetaminophen (TYLENOL) 325 MG tablet Take 650 mg by mouth every 6 (six) hours as needed for moderate pain.   Marland Kitchen diltiazem (CARDIZEM CD) 360 MG 24 hr capsule Take 360 mg by mouth daily.  . feeding supplement, ENSURE ENLIVE, (ENSURE ENLIVE) LIQD Take 237 mLs by mouth 2 (two) times daily between  meals.  . insulin glargine (LANTUS) 100 UNIT/ML injection Inject 0-8 Units into the skin daily. Sliding scale: 0 units if <100, 5 units if 100-150, 8 units if >150.  . megestrol (MEGACE) 400 MG/10ML suspension Take 10 mLs (400 mg total) by mouth 2 (two) times daily. (Patient taking differently: Take 400 mg by mouth 2 (two) times daily as needed (poor appetite.). )  . metoprolol succinate (TOPROL-XL) 25 MG 24 hr tablet TAKE 1 TABLET BY MOUTH ONCE DAILY  . multivitamin (RENA-VIT) TABS tablet Take 1 tablet by mouth at bedtime.   . ondansetron (ZOFRAN) 4 MG tablet Take 1 tablet (4 mg total) by mouth every 6 (six) hours as needed for nausea.  . pantoprazole (PROTONIX) 40 MG tablet Take 40 mg by mouth at bedtime.   . sertraline (ZOLOFT) 100 MG tablet Take 100 mg by mouth at bedtime.   . simvastatin (ZOCOR) 20 MG tablet Take 20 mg by mouth at bedtime.  . traMADol-acetaminophen (ULTRACET) 37.5-325 MG tablet Take 1 tablet by mouth every 6 (six) hours as needed for moderate pain.   No facility-administered encounter medications on file as of 11/14/2018.     PHYSICAL EXAM:  VS  170/87 98.2-78-18 95% weight: 116 lbs.  General: NAD, frail appearing, thin  Cardiovascular: regular rate and rhythm, S1S2 Pulmonary: clear all fields, no cough Abdomen: soft, nontender, + bowel sounds, + constiptaion GU: no suprapubic tenderness, incontinence Extremities: no edema, no joint deformities Skin: no rashes, no wounds  Neurological: Weakness , sleep poor, dementia with poor recall, verbal  Marijo File DNP, AGPCNP-BC

## 2018-11-18 DIAGNOSIS — I10 Essential (primary) hypertension: Secondary | ICD-10-CM | POA: Diagnosis not present

## 2018-11-18 DIAGNOSIS — Z23 Encounter for immunization: Secondary | ICD-10-CM | POA: Diagnosis not present

## 2018-11-18 DIAGNOSIS — Z0001 Encounter for general adult medical examination with abnormal findings: Secondary | ICD-10-CM | POA: Diagnosis not present

## 2018-12-22 ENCOUNTER — Telehealth: Payer: Self-pay | Admitting: Primary Care

## 2018-12-22 NOTE — Telephone Encounter (Signed)
T/c from MD office re seeing patient for possible UTI. NP Gayleen Orem will follow up.

## 2018-12-23 ENCOUNTER — Other Ambulatory Visit: Payer: Medicare Other | Admitting: Internal Medicine

## 2018-12-23 DIAGNOSIS — Z515 Encounter for palliative care: Secondary | ICD-10-CM

## 2019-01-19 ENCOUNTER — Other Ambulatory Visit: Payer: Medicare Other | Admitting: Internal Medicine

## 2019-01-19 NOTE — Progress Notes (Signed)
    Therapist, nutritional Palliative Care Consult Note Telephone: [phone]  Fax: [phone]  PATIENT NAME: Shari Golden DOB: 03-09-1930 MRN: [id number]  PRIMARY CARE PROVIDER:   Richardean Chimera, MD  REFERRING PROVIDER:  Richardean Chimera, MD [address] [place], [place] [address]  RESPONSIBLE PARTY:   Victorino Dike Bingim(daughter)     RECOMMENDATIONS and PLAN:   Palliative care encoounter Z51.5  1. Memory loss without behaviors:  Advanced and stable at FAST stage 7c.  She will continue to require total care assistance and monitoring.  At risk of potential elopement.  Provide safe environment with extra door locks  2.  Protein calorie malnutrition: Chronic and related to advanced dementia and CKD stage IV.  Eating and drinking protein shakes with assistance. No actual weight today.  Monitor for recurrent decline.  No escalation of therapies at this time.  3.  Advanced care planning:DNAR.  Daughter plans to continue care at her home with assistance of hired aids. No reported plans for escalation of treatments.  Continue to monitor since recent discharge from Hospice care.  Palliative care will follow up in aprox 1 month   I spent providing this consultation,  from 1100 to 1135 at home. More than 50% of the time in this consultation was spent coordinating communication with patient, daughter and caregiver.   HISTORY OF PRESENT ILLNESS:  Follow-up with  Shari Golden. Daughter and caregiver report no recent illnesses.  Improved sleeping at HS and fair appetite.  She remains underweight.   Palliative Care was asked to help address goals of care.   CODE STATUS: DNAR  PPS: 40% HOSPICE ELIGIBILITY/DIAGNOSIS: TBD  PAST MEDICAL HISTORY:  Past Medical History:  Diagnosis Date  . Anemia   . Anxiety   . Arthritis   . Carotid artery disease (HCC)    a. 12/2015 Carotid u/s: 1-39% bilat ICA stenosis-->f/u prn.  . CKD (chronic kidney disease), stage III (HCC)    Dr.  Fausto Skillern  . Constipation   . Essential hypertension   . GERD (gastroesophageal reflux disease)   . History of cardiac catheterization    a. 2004 Cath: Normal coronaries.  Marland Kitchen PAF (paroxysmal atrial fibrillation) (HCC)    a. Remotely documented without recurrence.  . Peripheral neuropathy   . Type II diabetes mellitus (HCC)    a. 01/2017 HbA1c 7.0.   PHYSICAL EXAM:   General: NAD, very frail and thin appearing elderly female at DR table. Cardiovascular: regular rate and rhythm Pulmonary: clear ant fields Abdomen: soft, nontender, + bowel sound Extremities:no muscle mass Skin: exposed skin is intact Neurological:alert & oriented x1, generalized weakness Psych:  Calm and cooperative   Margaretha Sheffield, NP

## 2019-02-11 DIAGNOSIS — Z515 Encounter for palliative care: Secondary | ICD-10-CM | POA: Insufficient documentation

## 2019-02-18 ENCOUNTER — Telehealth: Payer: Self-pay

## 2019-02-18 NOTE — Telephone Encounter (Signed)
Phone call received from patient's granddaughter who requested to schedule a visit with Palliative care. Visit scheduled with Talbert Forest NP for 02/19/2019

## 2019-02-19 ENCOUNTER — Other Ambulatory Visit: Payer: Self-pay

## 2019-02-19 ENCOUNTER — Other Ambulatory Visit: Payer: Medicare Other | Admitting: Internal Medicine

## 2019-02-19 DIAGNOSIS — Z515 Encounter for palliative care: Secondary | ICD-10-CM

## 2019-02-19 NOTE — Progress Notes (Signed)
Therapist, nutritional Palliative Care Consult Note Telephone: [phone]  Fax: [phone]  PATIENT NAME: Shari Golden DOB: 1930/04/14 MRN: [id number]  PRIMARY CARE PROVIDER:   Richardean Chimera, MD  REFERRING PROVIDER:  Richardean Chimera, MD [address] [place], [place] [address]   Jalene Mullet TO FACE VISIT  I came to see face-to-face Ms. Shari Golden to assess her for hospice eligibility.   This is an 85 year old female who continues to show decline as evidence by chart review and report from daughter that patient has had decreased nutritional intake of 10-25% including an occasional Ensure. She has declined functionally and cognitively with requirements of feeding,  total care of all ADLs and is only ambulatory minimally in the home with assistance. Incontinent of B&B.  Daughter reports that she has begun sleeping more during the day and requires assistance in sitting up and getting out of bed.   Shes had a new occurrence of dark vaginal discharge for the past 2 weeks with the appearance of the end of a menstrual cycle. Daughter and caregiver deny odor or apparent discomfort from perineal region. Past surgical history of a hysterectomy.  Her latest available labs were in June 2019 with a  CREA-2.1 and GFR-23. Her Hgb was 8.3 after receiving 1 unit of blood due to worsening of anemia.  Daughter has decided upon not to have any evaluation of suspected vaginal bleeding, additional blood transfusions, recurrent hospitalizations or visits to PCP. She is in favor of re-admission to Hospice care services for comfort only.  Patient's only medication is Trazadone 50mg  2 PO qHS.     PPS is 30%.    FAST stage is 7c  Nutritional assessment:  She requires assistance with feeding and eats bites and sips per daughter and caregiver.  She has lost we 10 pounds in the past 8 months with a current weight of 99 pounds.  Additional weight loss since discharge from Hospice care in November 2019.   Albumin level is not available   Physical exam   BP 106/62 P69 R 20 Sats 96% on room air  General: Cachectic, frail elderly female resting in recliner.  In NAD EENT: Dry mucus membranes Cardiovascular: regular rate and rhythm Pulmonary: Unlabored resp.  Clear lung fields Abdomen: soft, nontender, + bowel sounds. Concave Extremities: No muscle mass or edema.  Very thin Skin:  exposed skin is intact Neurological: Alert and oriented to self. Answers in 1 or 2 words.  Intermittently unable to follow commands.   The current treatment plan determined by the patient is comfort care only.  I spent 60 minutes from 1400 to 1500 at the patient chairside.  More than 50% of the time was spent in communication with the patient, daughter and caregiver. Phoned Drs. Hertwick and Dr. Aurther Loft Daniel's office to discuss current patient state of decline and recommendations for readmission to Hospice care.  Both physicians are in agreement of Hospice eligibility.  Verbal order for Hospice admission was phoned to referral center of [facility].  Daughter is in agreement with plan.   CODE STATUS: DNAR  HOSPICE ELIGIBILITY/DIAGNOSIS: YES, Advanced dementia, vaginal bleeding in light of a previous hysterectomy.  No desire for any evaluations or escalation of care  PAST MEDICAL HISTORY:  Past Medical History:  Diagnosis Date   Anemia    Anxiety    Arthritis    Carotid artery disease (HCC)    a. 12/2015 Carotid u/s: 1-39% bilat ICA stenosis-->f/u prn.  CKD (chronic kidney disease), stage III (HCC)    Dr. Fausto Skillern   Constipation    Essential hypertension    GERD (gastroesophageal reflux disease)    History of cardiac catheterization    a. 2004 Cath: Normal coronaries.   PAF (paroxysmal atrial fibrillation) (HCC)    a. Remotely documented without recurrence.   Peripheral neuropathy    Type II diabetes mellitus (HCC)    a. 01/2017 HbA1c 7.0.     Margaretha Sheffield, NP-C

## 2019-06-09 IMAGING — CT CT CERVICAL SPINE W/O CM
3 of 14 series · 4 of 33 positions shown, 5 images · non-contrast
Comparison: Brain MRI 04/30/2016

CLINICAL DATA: Worsening confusion. Recent fall with head injury.
Recent urinary tract infection. History of dementia.

EXAM:
CT HEAD WITHOUT CONTRAST
CT CERVICAL SPINE WITHOUT CONTRAST
TECHNIQUE: Multidetector CT imaging of the head and cervical spine was
performed following the standard protocol without intravenous
contrast. Multiplanar CT image reconstructions of the cervical spine
were also generated.

[Series 10: orthogonal bone · axial · 0.23mm/px · z∈[-214,-151]mm · 2 of 103 slices shown, 3 images]
[im 35/103  soft-tissue]
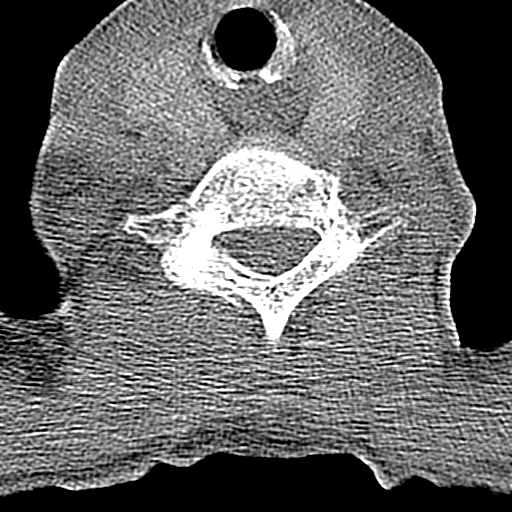
[im 35/103  bone]
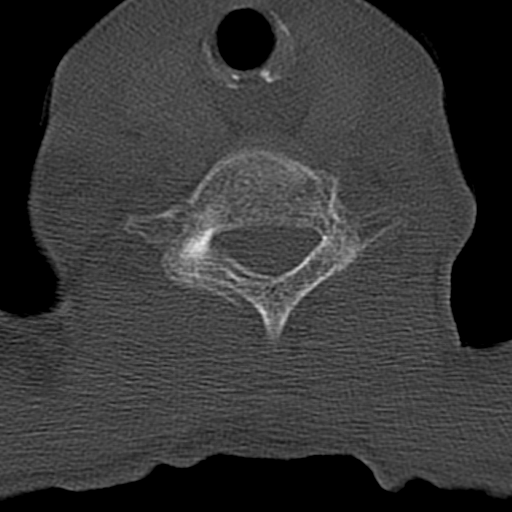
[im 69/103  bone]
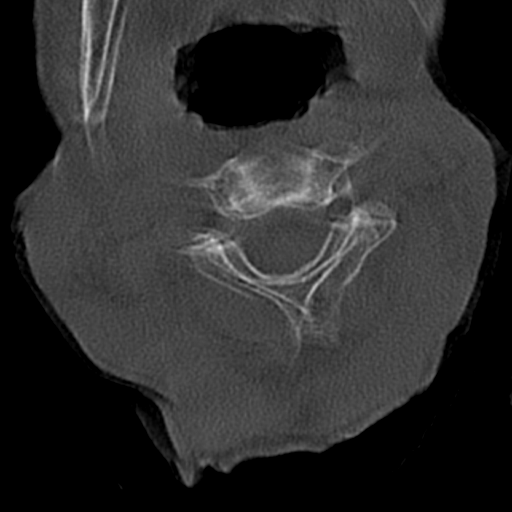

[Series 12: sagittal bone · sagittal · 0.34mm/px · 1 of 61 slices shown]
[im 31/61  bone]
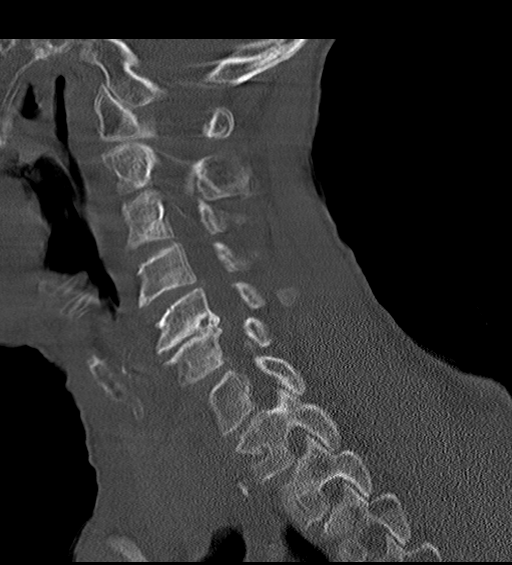

[Series 23: cor head 2 · coronal · 0.29mm/px · 1 of 281 slices shown]
[im 141/281  bone]
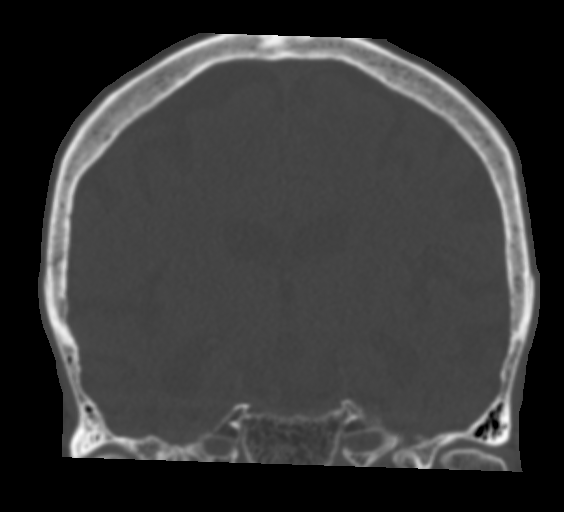

[4 of 33 positions shown; findings below may reference images not displayed]

FINDINGS: CT HEAD FINDINGS

Brain: There is no evidence of acute large territory infarct,
intracranial hemorrhage, mass, midline shift, or extra-axial fluid
collection. Mild cerebral atrophy is within normal limits for age.
Periventricular white matter hypodensities are nonspecific but
compatible with minimal for age chronic small vessel ischemic
disease.

Vascular: Calcified atherosclerosis at the skull base. No gross
hyperdense vessel.

Skull: No fracture focal osseous lesion.

Sinuses/Orbits: Visualized paranasal sinuses and mastoid air cells
are clear. Bilateral cataract extraction is noted.

Other: None.

CT CERVICAL SPINE FINDINGS

Alignment: Cervical spine straightening. Trace retrolisthesis of C5
on C6, likely degenerative.

Skull base and vertebrae: No acute fracture or suspicious osseous
lesion.

Soft tissues and spinal canal: No prevertebral fluid or swelling. No
visible canal hematoma.

Disc levels: Focally advanced disc degeneration at C5-6 including
severe disc space narrowing, endplate sclerosis, and spurring
resulting in mild left greater than right neural foraminal stenosis.
Mild multilevel left-sided cervical facet arthrosis. No significant
osseous spinal canal stenosis.

Upper chest: Clear lung apices.

Other: Left greater than right thyroid lobe enlargement and
heterogeneity diffusely with evidence of multiple small underlying
nodules compatible with multinodular goiter. Moderate right and
extensive left carotid bifurcation calcified atherosclerosis.
IMPRESSION: No evidence of acute intracranial abnormality or cervical spine
fracture.

## 2019-06-10 IMAGING — DX DG CHEST 1V PORT
1 series · 1 of 1 positions shown · non-contrast
Comparison: 04/29/2018

CLINICAL DATA: Fall

EXAM:
PORTABLE CHEST 1 VIEW

[chest ap]
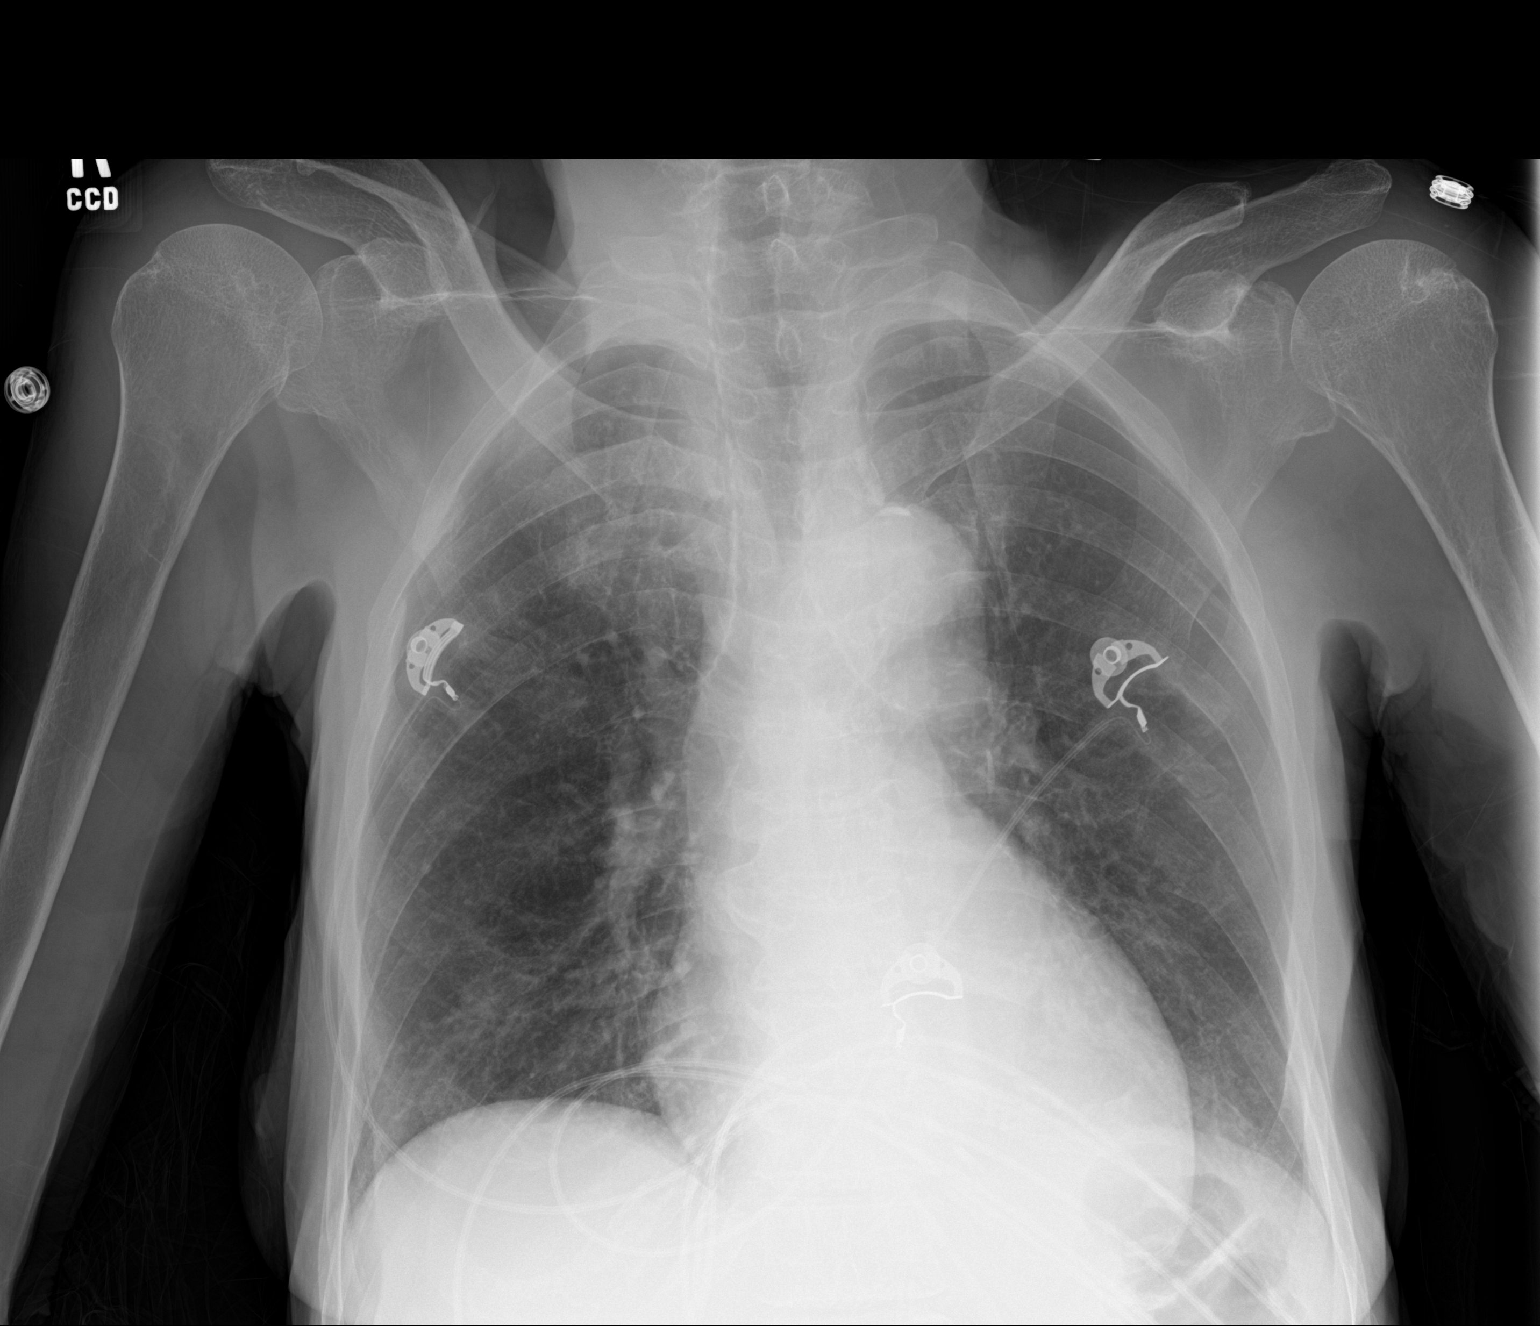

[1 of 1 positions shown; findings below may reference images not displayed]

FINDINGS: Heart size is normal. There is atherosclerosis and tortuosity of the
aorta. There is mild atelectasis in the left lower lobe. Densities
previously shot on the right appear to correlate with anterior rib
fractures. No evidence of heart failure or effusion. Unhealed
fracture noted of the left posterior 6 and seventh ribs, though not
acute today.
IMPRESSION: Mild left lower lobe atelectasis.

## 2019-06-10 IMAGING — DX DG PORTABLE PELVIS
1 series · 1 of 1 positions shown · non-contrast
Comparison: None.

CLINICAL DATA: Pain following fall

EXAM:
PORTABLE PELVIS 1-2 VIEWS

[pelvis ap]
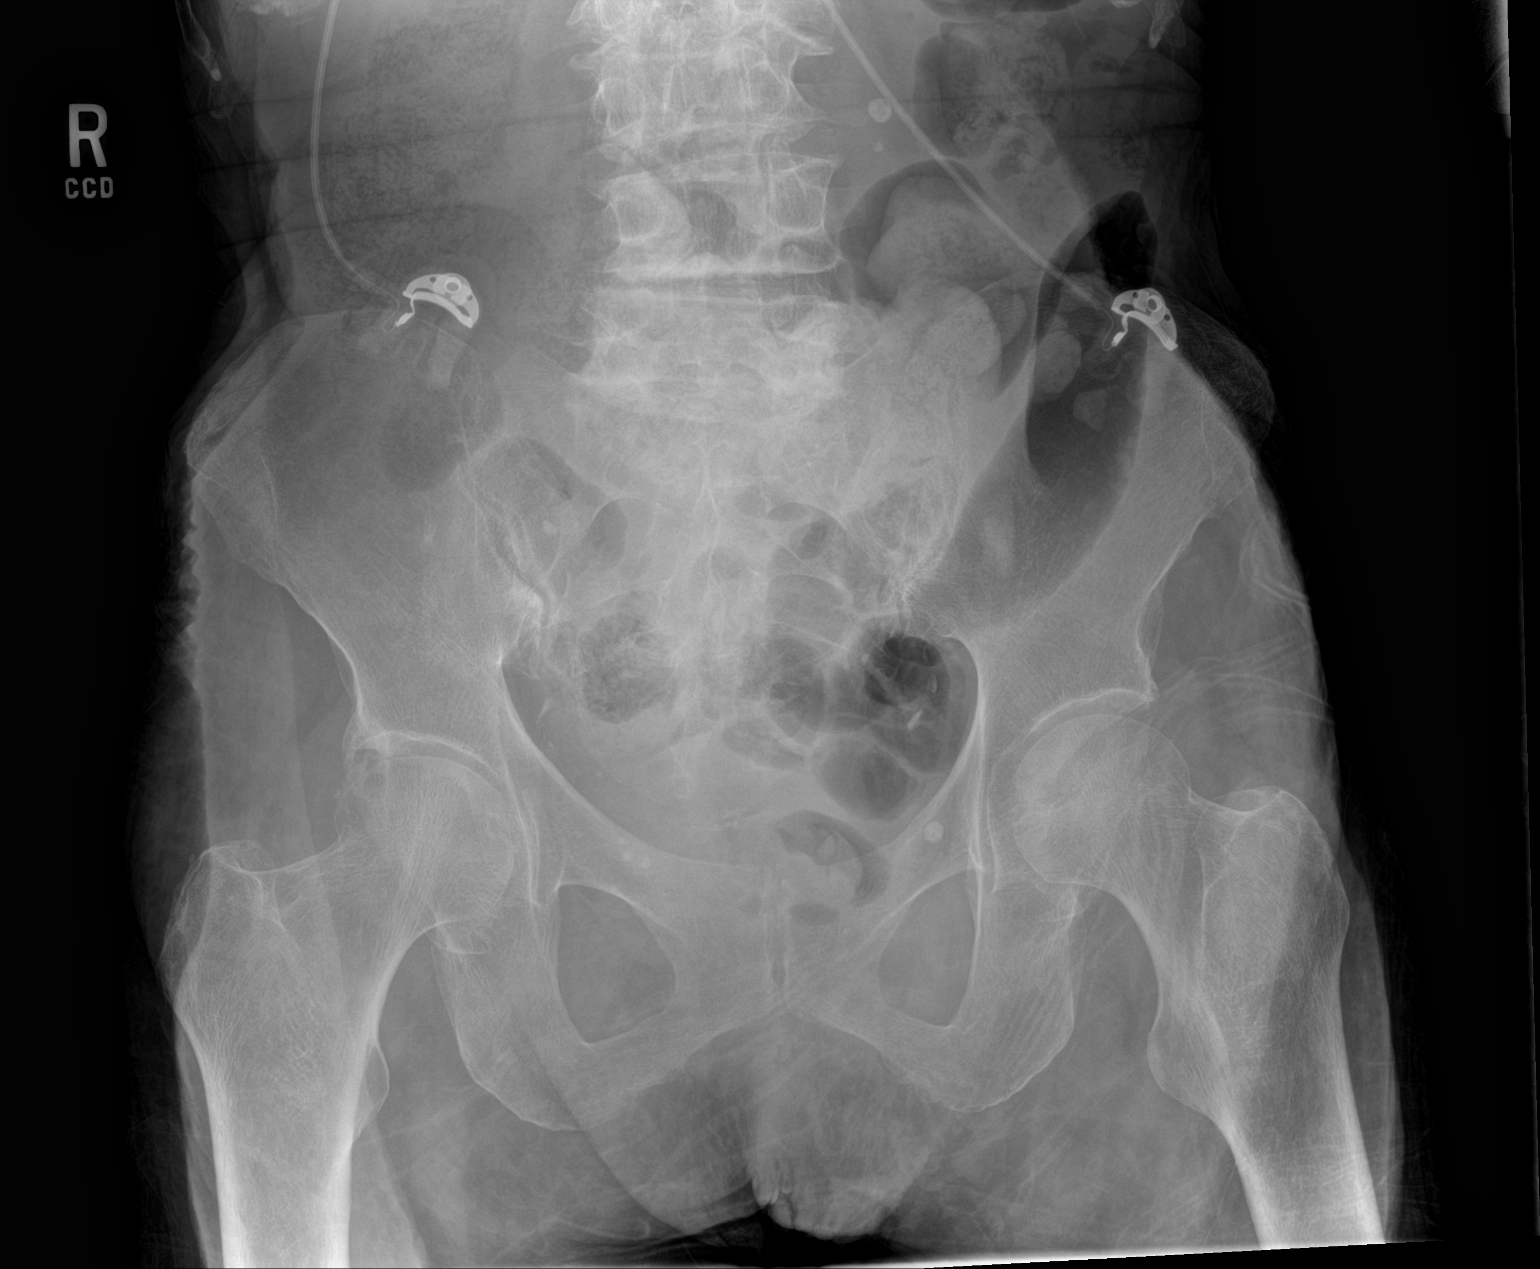

[1 of 1 positions shown; findings below may reference images not displayed]

FINDINGS: There is no evidence of pelvic fracture or dislocation. There is
mild symmetric narrowing of both hip joints. No erosive change.
IMPRESSION: Mild symmetric narrowing of both hip joints. No acute fracture or
dislocation.

## 2020-01-11 DEATH — deceased
